# Patient Record
Sex: Male | Born: 1935 | Race: White | Hispanic: No | Smoking: Never smoker
Health system: Southern US, Community
[De-identification: ages and names within clinical notes are randomized; demographics above are authoritative.]

## PROBLEM LIST (undated history)

## (undated) DIAGNOSIS — I619 Nontraumatic intracerebral hemorrhage, unspecified: Secondary | ICD-10-CM

## (undated) DIAGNOSIS — E039 Hypothyroidism, unspecified: Secondary | ICD-10-CM

## (undated) DIAGNOSIS — I10 Essential (primary) hypertension: Secondary | ICD-10-CM

## (undated) DIAGNOSIS — K219 Gastro-esophageal reflux disease without esophagitis: Secondary | ICD-10-CM

## (undated) DIAGNOSIS — I639 Cerebral infarction, unspecified: Secondary | ICD-10-CM

## (undated) DIAGNOSIS — I219 Acute myocardial infarction, unspecified: Secondary | ICD-10-CM

## (undated) HISTORY — DX: Cerebral infarction, unspecified: I63.9

## (undated) HISTORY — DX: Nontraumatic intracerebral hemorrhage, unspecified: I61.9

## (undated) HISTORY — DX: Hypothyroidism, unspecified: E03.9

## (undated) HISTORY — PX: EYE SURGERY: SHX253

## (undated) HISTORY — DX: Essential (primary) hypertension: I10

---

## 2005-01-03 ENCOUNTER — Ambulatory Visit: Payer: Self-pay | Admitting: Unknown Physician Specialty

## 2005-08-17 ENCOUNTER — Ambulatory Visit: Payer: Self-pay | Admitting: Internal Medicine

## 2005-11-23 ENCOUNTER — Ambulatory Visit: Payer: Self-pay | Admitting: Internal Medicine

## 2006-09-14 ENCOUNTER — Other Ambulatory Visit: Payer: Self-pay

## 2006-09-14 ENCOUNTER — Inpatient Hospital Stay: Payer: Self-pay | Admitting: Internal Medicine

## 2008-09-14 ENCOUNTER — Ambulatory Visit: Payer: Self-pay | Admitting: Ophthalmology

## 2008-09-17 DIAGNOSIS — I619 Nontraumatic intracerebral hemorrhage, unspecified: Secondary | ICD-10-CM

## 2008-09-17 HISTORY — DX: Nontraumatic intracerebral hemorrhage, unspecified: I61.9

## 2008-09-27 ENCOUNTER — Ambulatory Visit: Payer: Self-pay | Admitting: Ophthalmology

## 2009-04-30 ENCOUNTER — Inpatient Hospital Stay (HOSPITAL_COMMUNITY): Admission: EM | Admit: 2009-04-30 | Discharge: 2009-05-18 | Payer: Self-pay | Admitting: Emergency Medicine

## 2009-05-09 ENCOUNTER — Ambulatory Visit: Payer: Self-pay | Admitting: Physical Medicine & Rehabilitation

## 2009-05-10 ENCOUNTER — Ambulatory Visit: Payer: Self-pay | Admitting: Pulmonary Disease

## 2009-05-11 ENCOUNTER — Encounter (INDEPENDENT_AMBULATORY_CARE_PROVIDER_SITE_OTHER): Payer: Self-pay | Admitting: Pulmonary Disease

## 2009-06-14 ENCOUNTER — Ambulatory Visit: Payer: Self-pay | Admitting: Internal Medicine

## 2009-06-20 ENCOUNTER — Encounter: Payer: Self-pay | Admitting: Internal Medicine

## 2009-07-18 ENCOUNTER — Encounter: Payer: Self-pay | Admitting: Internal Medicine

## 2010-04-19 ENCOUNTER — Ambulatory Visit: Payer: Self-pay | Admitting: Internal Medicine

## 2010-10-03 IMAGING — CT CT HEAD WITHOUT CONTRAST
1 series · 15 of 30 positions shown, 19 images · non-contrast
Comparison: none

REASON FOR EXAM: CVA mental status deterioration
COMMENTS:

[Series 2: soft tissue · axial · 0.46mm/px · z∈[+184,+318]mm · 15 of 30 slices shown, 19 images]
[im 2/30  brain]
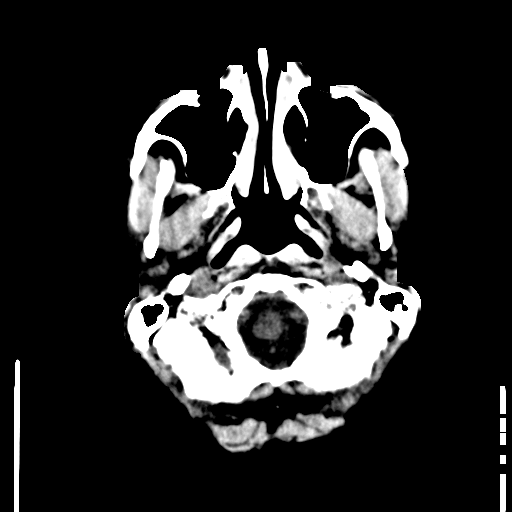
[im 2/30  bone]
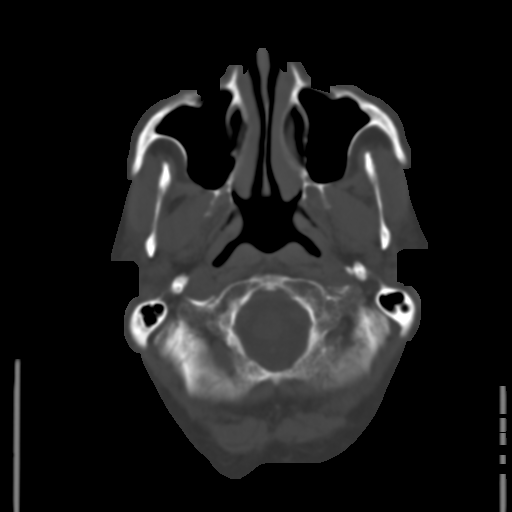
[im 4/30  brain]
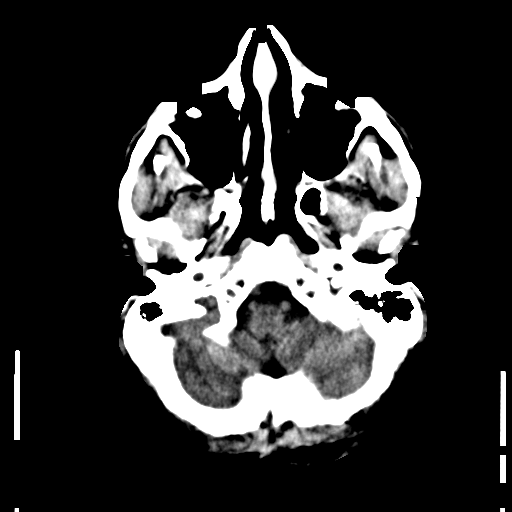
[im 6/30  brain]
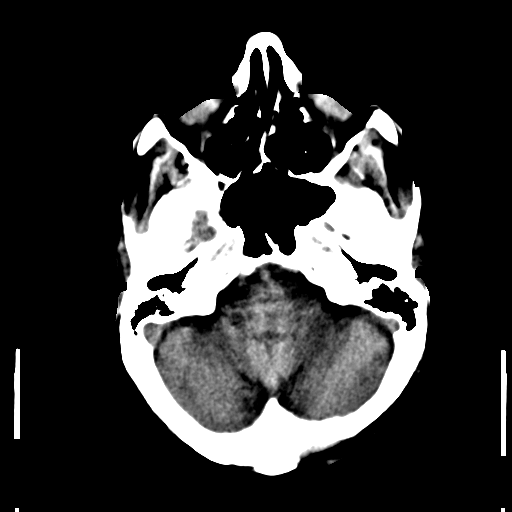
[im 8/30  brain]
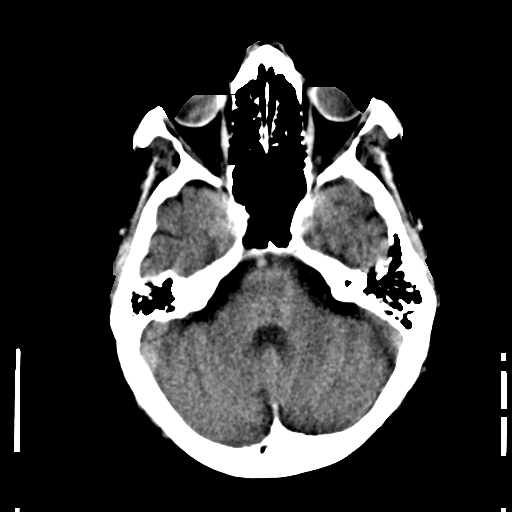
[im 10/30  brain]
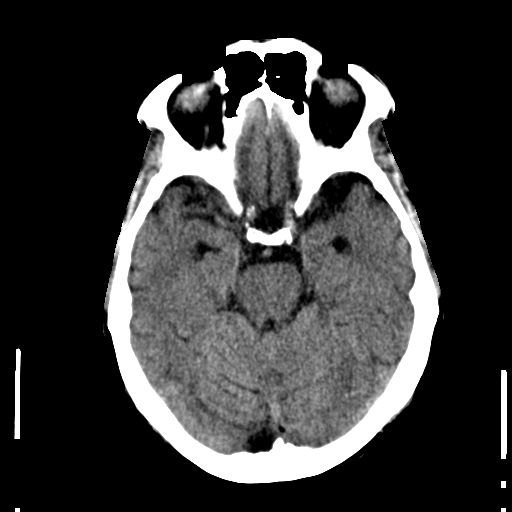
[im 10/30  bone]
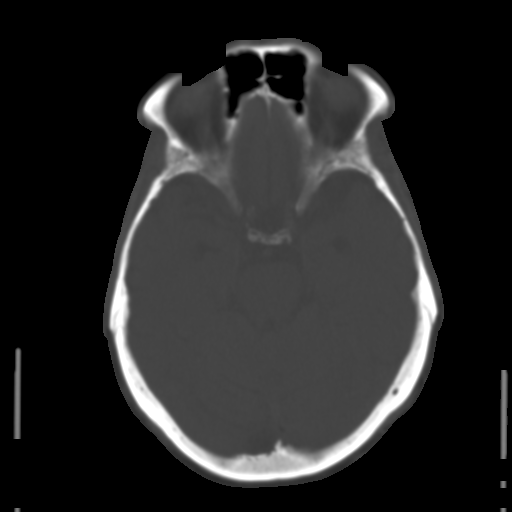
[im 12/30  brain]
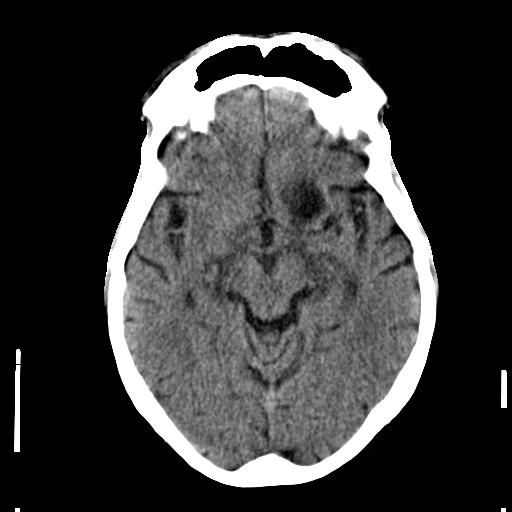
[im 14/30  brain]
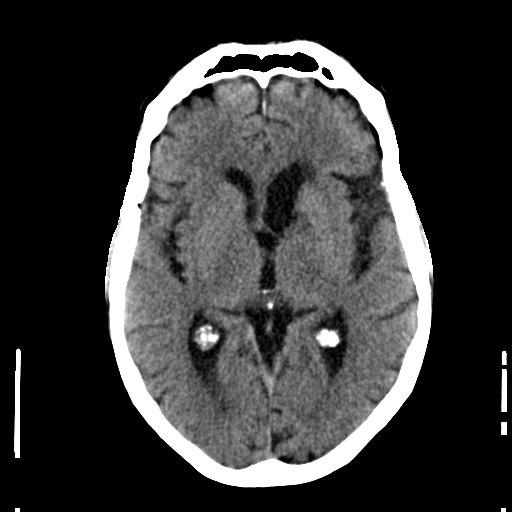
[im 16/30  brain]
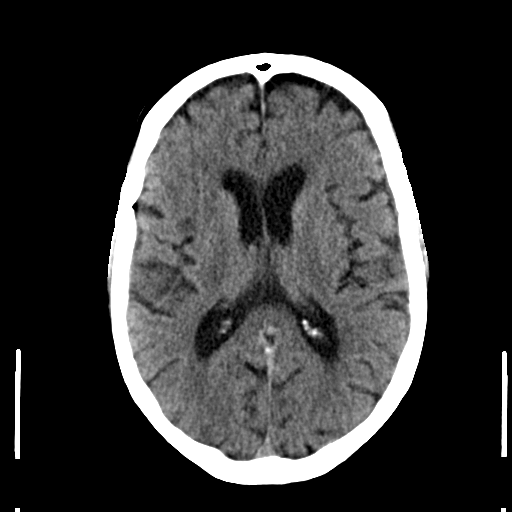
[im 17/30  brain]
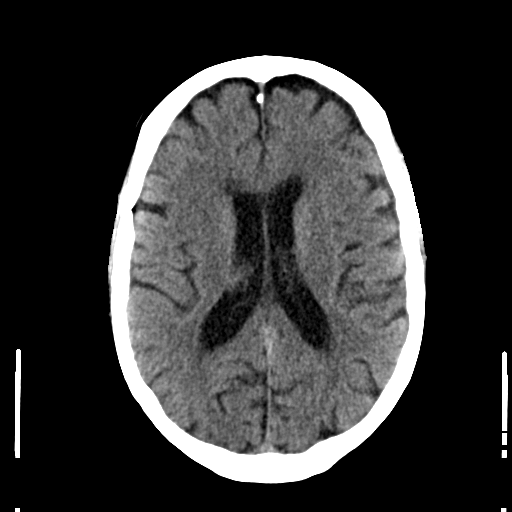
[im 17/30  bone]
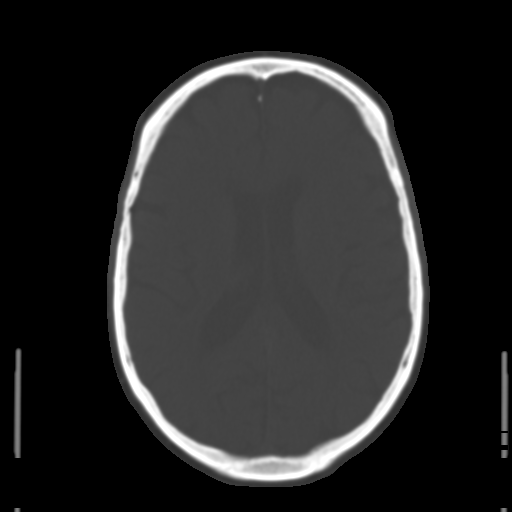
[im 19/30  brain]
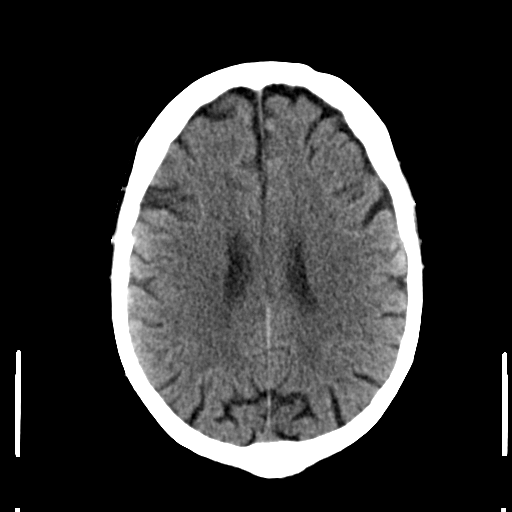
[im 21/30  brain]
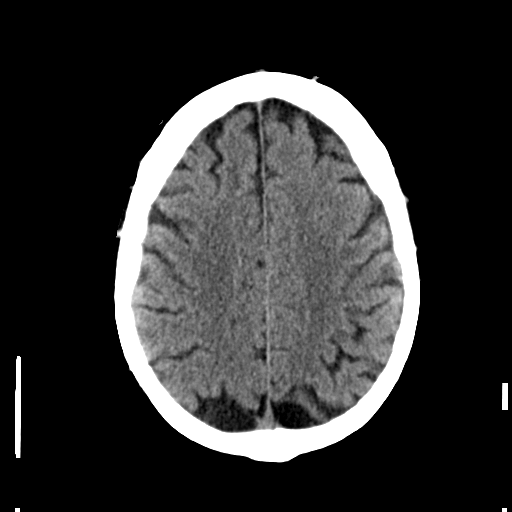
[im 23/30  brain]
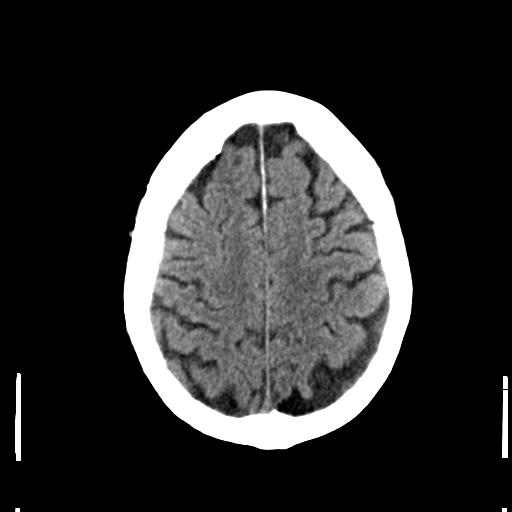
[im 25/30  brain]
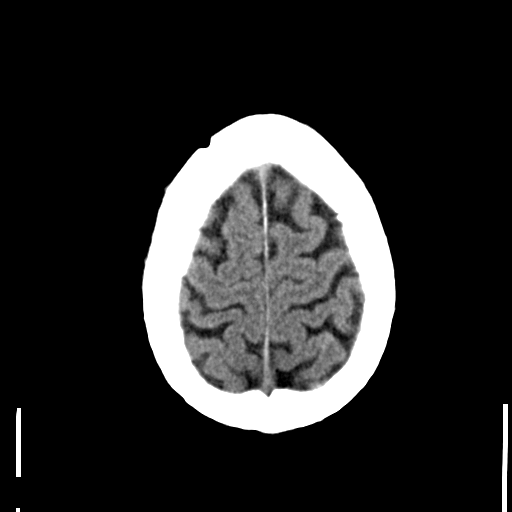
[im 25/30  bone]
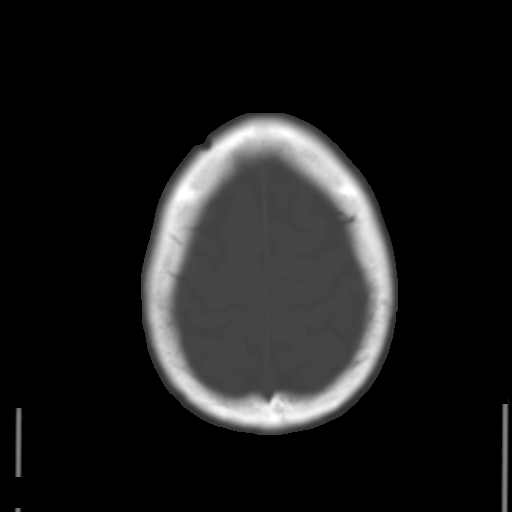
[im 27/30  brain]
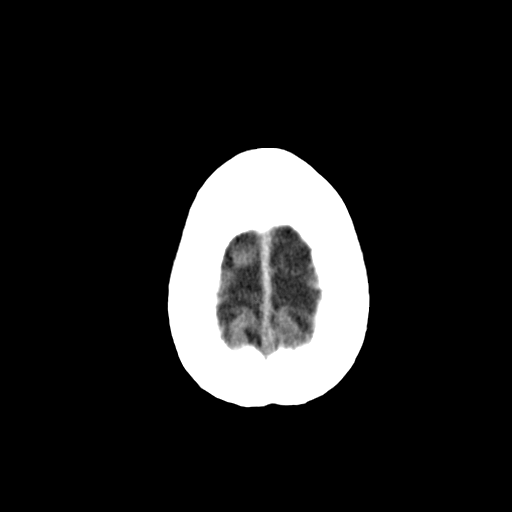
[im 29/30  brain]
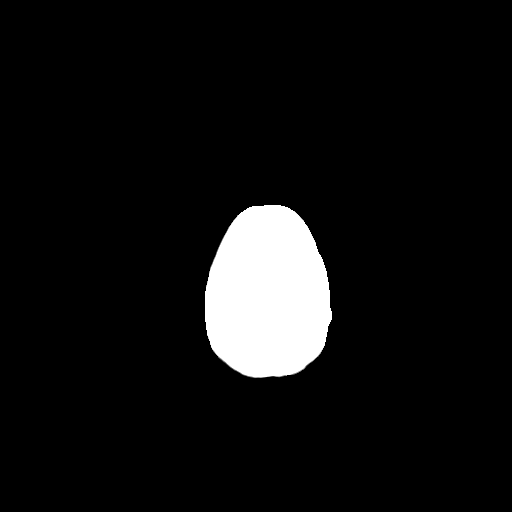

[15 of 30 positions shown; findings below may reference images not displayed]

PROCEDURE:     CT  - CT HEAD WITHOUT CONTRAST  - April 19, 2010  [DATE]

RESULT:     Axial noncontrast CT scanning was performed through the brain at
5 mm intervals and slice thicknesses. Comparison is made to the study 14 June, 2009.

The ventricles are normal in size and position. There is ex vacuo dilation
of the frontal horn of the left lateral ventricle due to adjacent
encephalomalacia at the site of previous infarction. There is mild diffuse
age-appropriate cerebral and cerebellar atrophy. There is no intracranial
hemorrhage nor evidence of acute evolving ischemic event. The cerebellum and
brainstem are normal in density. At bone window settings I do not see
evidence of an acute skull fracture. The observed portions of the paranasal
sinuses are clear.
IMPRESSION: 1. I do not see evidence of an acute ischemic or hemorrhagic infarction.
2. There has been ongoing evolution of an area of encephalomalacia in the
posterior left frontal lobe with adjacent ex vacuo dilation of the left
lateral ventricle.

## 2010-12-23 LAB — BASIC METABOLIC PANEL
BUN: 10 mg/dL (ref 6–23)
BUN: 15 mg/dL (ref 6–23)
BUN: 20 mg/dL (ref 6–23)
BUN: 7 mg/dL (ref 6–23)
CO2: 25 mEq/L (ref 19–32)
CO2: 25 mEq/L (ref 19–32)
CO2: 28 mEq/L (ref 19–32)
CO2: 29 mEq/L (ref 19–32)
CO2: 31 mEq/L (ref 19–32)
Calcium: 7.8 mg/dL — ABNORMAL LOW (ref 8.4–10.5)
Calcium: 8 mg/dL — ABNORMAL LOW (ref 8.4–10.5)
Calcium: 8.6 mg/dL (ref 8.4–10.5)
Calcium: 8.9 mg/dL (ref 8.4–10.5)
Chloride: 101 mEq/L (ref 96–112)
Chloride: 102 mEq/L (ref 96–112)
Chloride: 105 mEq/L (ref 96–112)
Chloride: 107 mEq/L (ref 96–112)
Chloride: 97 mEq/L (ref 96–112)
Creatinine, Ser: 0.69 mg/dL (ref 0.4–1.5)
Creatinine, Ser: 0.76 mg/dL (ref 0.4–1.5)
Creatinine, Ser: 0.76 mg/dL (ref 0.4–1.5)
Creatinine, Ser: 0.83 mg/dL (ref 0.4–1.5)
GFR calc Af Amer: >60 mL/min (ref >60)
GFR calc Af Amer: >60 mL/min (ref >60)
GFR calc Af Amer: >60 mL/min (ref >60)
GFR calc Af Amer: >60 mL/min (ref >60)
GFR calc non Af Amer: >60 mL/min (ref >60)
GFR calc non Af Amer: >60 mL/min (ref >60)
GFR calc non Af Amer: >60 mL/min (ref >60)
GFR calc non Af Amer: >60 mL/min (ref >60)
Glucose, Bld: 109 mg/dL — ABNORMAL HIGH (ref 70–99)
Glucose, Bld: 117 mg/dL — ABNORMAL HIGH (ref 70–99)
Glucose, Bld: 121 mg/dL — ABNORMAL HIGH (ref 70–99)
Glucose, Bld: 136 mg/dL — ABNORMAL HIGH (ref 70–99)
Potassium: 2.6 mEq/L — CL (ref 3.5–5.1)
Potassium: 2.8 mEq/L — ABNORMAL LOW (ref 3.5–5.1)
Potassium: 2.8 mEq/L — ABNORMAL LOW (ref 3.5–5.1)
Potassium: 3.4 mEq/L — ABNORMAL LOW (ref 3.5–5.1)
Sodium: 134 mEq/L — ABNORMAL LOW (ref 135–145)
Sodium: 136 mEq/L (ref 135–145)
Sodium: 137 mEq/L (ref 135–145)
Sodium: 137 mEq/L (ref 135–145)
Sodium: 138 mEq/L (ref 135–145)

## 2010-12-23 LAB — GLUCOSE, CAPILLARY
Glucose-Capillary: 100 mg/dL — ABNORMAL HIGH (ref 70–99)
Glucose-Capillary: 100 mg/dL — ABNORMAL HIGH (ref 70–99)
Glucose-Capillary: 100 mg/dL — ABNORMAL HIGH (ref 70–99)
Glucose-Capillary: 102 mg/dL — ABNORMAL HIGH (ref 70–99)
Glucose-Capillary: 104 mg/dL — ABNORMAL HIGH (ref 70–99)
Glucose-Capillary: 108 mg/dL — ABNORMAL HIGH (ref 70–99)
Glucose-Capillary: 109 mg/dL — ABNORMAL HIGH (ref 70–99)
Glucose-Capillary: 111 mg/dL — ABNORMAL HIGH (ref 70–99)
Glucose-Capillary: 112 mg/dL — ABNORMAL HIGH (ref 70–99)
Glucose-Capillary: 114 mg/dL — ABNORMAL HIGH (ref 70–99)
Glucose-Capillary: 114 mg/dL — ABNORMAL HIGH (ref 70–99)
Glucose-Capillary: 115 mg/dL — ABNORMAL HIGH (ref 70–99)
Glucose-Capillary: 117 mg/dL — ABNORMAL HIGH (ref 70–99)
Glucose-Capillary: 117 mg/dL — ABNORMAL HIGH (ref 70–99)
Glucose-Capillary: 118 mg/dL — ABNORMAL HIGH (ref 70–99)
Glucose-Capillary: 118 mg/dL — ABNORMAL HIGH (ref 70–99)
Glucose-Capillary: 122 mg/dL — ABNORMAL HIGH (ref 70–99)
Glucose-Capillary: 131 mg/dL — ABNORMAL HIGH (ref 70–99)
Glucose-Capillary: 132 mg/dL — ABNORMAL HIGH (ref 70–99)
Glucose-Capillary: 133 mg/dL — ABNORMAL HIGH (ref 70–99)
Glucose-Capillary: 135 mg/dL — ABNORMAL HIGH (ref 70–99)
Glucose-Capillary: 143 mg/dL — ABNORMAL HIGH (ref 70–99)
Glucose-Capillary: 148 mg/dL — ABNORMAL HIGH (ref 70–99)
Glucose-Capillary: 148 mg/dL — ABNORMAL HIGH (ref 70–99)
Glucose-Capillary: 150 mg/dL — ABNORMAL HIGH (ref 70–99)
Glucose-Capillary: 156 mg/dL — ABNORMAL HIGH (ref 70–99)
Glucose-Capillary: 196 mg/dL — ABNORMAL HIGH (ref 70–99)
Glucose-Capillary: 96 mg/dL (ref 70–99)
Glucose-Capillary: 96 mg/dL (ref 70–99)
Glucose-Capillary: 99 mg/dL (ref 70–99)

## 2010-12-23 LAB — COMPREHENSIVE METABOLIC PANEL
ALT: 53 U/L (ref 0–53)
Alkaline Phosphatase: 76 U/L (ref 39–117)
BUN: 19 mg/dL (ref 6–23)
Chloride: 98 mEq/L (ref 96–112)
Glucose, Bld: 100 mg/dL — ABNORMAL HIGH (ref 70–99)
Potassium: 2.9 mEq/L — ABNORMAL LOW (ref 3.5–5.1)
Sodium: 133 mEq/L — ABNORMAL LOW (ref 135–145)
Total Bilirubin: 1.6 mg/dL — ABNORMAL HIGH (ref 0.3–1.2)
Total Protein: 6.7 g/dL (ref 6.0–8.3)

## 2010-12-23 LAB — BLOOD GAS, ARTERIAL
Acid-Base Excess: 3.1 mmol/L — ABNORMAL HIGH (ref 0.0–2.0)
Acid-base deficit: 0.4 mmol/L (ref 0.0–2.0)
Acid-base deficit: 1.3 mmol/L (ref 0.0–2.0)
Drawn by: 31996
FIO2: 0.3 %
FIO2: 100 %
MECHVT: 550 mL
MECHVT: 550 mL
MECHVT: 550 mL
O2 Saturation: 98.3 %
O2 Saturation: 98.5 %
O2 Saturation: 99.8 %
PEEP: 5 cmH2O
Patient temperature: 97.7
RATE: 16 resp/min
RATE: 16 resp/min
TCO2: 27 mmol/L (ref 0–100)
pCO2 arterial: 35.9 mmHg (ref 35.0–45.0)
pH, Arterial: 7.52 — ABNORMAL HIGH (ref 7.350–7.450)

## 2010-12-23 LAB — URINALYSIS, ROUTINE W REFLEX MICROSCOPIC
Glucose, UA: NEGATIVE mg/dL
Ketones, ur: NEGATIVE mg/dL
Nitrite: NEGATIVE
Specific Gravity, Urine: 1.02 (ref 1.005–1.030)
pH: 7 (ref 5.0–8.0)

## 2010-12-23 LAB — LIPID PANEL
HDL: 52 mg/dL (ref >39)
Total CHOL/HDL Ratio: 5.3 RATIO
Triglycerides: 70 mg/dL (ref <150)
VLDL: 14 mg/dL (ref 0–40)

## 2010-12-23 LAB — CULTURE, BLOOD (ROUTINE X 2)

## 2010-12-23 LAB — HEMOGLOBIN A1C
Hgb A1c MFr Bld: 5.7 % (ref 4.6–6.1)
Mean Plasma Glucose: 117 mg/dL

## 2010-12-23 LAB — VANCOMYCIN, TROUGH: Vancomycin Tr: 6.8 ug/mL — ABNORMAL LOW (ref 10.0–20.0)

## 2010-12-23 LAB — CBC
HCT: 37.7 % — ABNORMAL LOW (ref 39.0–52.0)
HCT: 43.2 % (ref 39.0–52.0)
HCT: 49.5 % (ref 39.0–52.0)
Hemoglobin: 12.5 g/dL — ABNORMAL LOW (ref 13.0–17.0)
Hemoglobin: 14.6 g/dL (ref 13.0–17.0)
Hemoglobin: 15.5 g/dL (ref 13.0–17.0)
Hemoglobin: 16.8 g/dL (ref 13.0–17.0)
MCHC: 33.8 g/dL (ref 30.0–36.0)
MCHC: 33.8 g/dL (ref 30.0–36.0)
MCHC: 34 g/dL (ref 30.0–36.0)
MCV: 96.1 fL (ref 78.0–100.0)
MCV: 96.2 fL (ref 78.0–100.0)
MCV: 96.3 fL (ref 78.0–100.0)
Platelets: 144 10*3/uL — ABNORMAL LOW (ref 150–400)
Platelets: 211 10*3/uL (ref 150–400)
RBC: 3.86 MIL/uL — ABNORMAL LOW (ref 4.22–5.81)
RBC: 3.93 MIL/uL — ABNORMAL LOW (ref 4.22–5.81)
RBC: 4.49 MIL/uL (ref 4.22–5.81)
RBC: 4.74 MIL/uL (ref 4.22–5.81)
RBC: 5.16 MIL/uL (ref 4.22–5.81)
RDW: 12.8 % (ref 11.5–15.5)
RDW: 13 % (ref 11.5–15.5)
WBC: 10.9 10*3/uL — ABNORMAL HIGH (ref 4.0–10.5)
WBC: 15.8 10*3/uL — ABNORMAL HIGH (ref 4.0–10.5)
WBC: 24.1 10*3/uL — ABNORMAL HIGH (ref 4.0–10.5)

## 2010-12-23 LAB — DIFFERENTIAL
Basophils Absolute: 0 10*3/uL (ref 0.0–0.1)
Basophils Relative: 0 % (ref 0–1)
Lymphocytes Relative: 17 % (ref 12–46)
Monocytes Relative: 6 % (ref 3–12)
Neutro Abs: 8.5 10*3/uL — ABNORMAL HIGH (ref 1.7–7.7)
Neutrophils Relative %: 73 % (ref 43–77)

## 2010-12-23 LAB — FUNGUS CULTURE W SMEAR

## 2010-12-23 LAB — MAGNESIUM
Magnesium: 2 mg/dL (ref 1.5–2.5)
Magnesium: 2.1 mg/dL (ref 1.5–2.5)

## 2010-12-23 LAB — CSF CELL COUNT WITH DIFFERENTIAL
RBC Count, CSF: 20025 /mm3 — ABNORMAL HIGH
Tube #: 3

## 2010-12-23 LAB — CSF CULTURE W GRAM STAIN: Gram Stain: NONE SEEN

## 2010-12-23 LAB — URINE CULTURE: Colony Count: NO GROWTH

## 2010-12-23 LAB — EXPECTORATED SPUTUM ASSESSMENT W GRAM STAIN, RFLX TO RESP C

## 2010-12-23 LAB — POTASSIUM: Potassium: 3.4 mEq/L — ABNORMAL LOW (ref 3.5–5.1)

## 2010-12-23 LAB — URINE MICROSCOPIC-ADD ON

## 2010-12-23 LAB — PROTEIN AND GLUCOSE, CSF
Glucose, CSF: 44 mg/dL (ref 43–76)
Total  Protein, CSF: 268 mg/dL — ABNORMAL HIGH (ref 15–45)

## 2010-12-23 LAB — CULTURE, RESPIRATORY W GRAM STAIN

## 2010-12-23 LAB — PHOSPHORUS: Phosphorus: 1.9 mg/dL — ABNORMAL LOW (ref 2.3–4.6)

## 2010-12-23 LAB — AFB CULTURE WITH SMEAR (NOT AT ARMC)

## 2010-12-24 LAB — DIFFERENTIAL
Basophils Absolute: 0 10*3/uL (ref 0.0–0.1)
Basophils Absolute: 0.1 10*3/uL (ref 0.0–0.1)
Basophils Relative: 0 % (ref 0–1)
Eosinophils Relative: 2 % (ref 0–5)
Lymphocytes Relative: 36 % (ref 12–46)
Neutro Abs: 5.1 10*3/uL (ref 1.7–7.7)
Neutro Abs: 8.4 10*3/uL — ABNORMAL HIGH (ref 1.7–7.7)
Neutrophils Relative %: 54 % (ref 43–77)
Neutrophils Relative %: 89 % — ABNORMAL HIGH (ref 43–77)

## 2010-12-24 LAB — COMPREHENSIVE METABOLIC PANEL
Alkaline Phosphatase: 82 U/L (ref 39–117)
BUN: 14 mg/dL (ref 6–23)
BUN: 14 mg/dL (ref 6–23)
CO2: 26 mEq/L (ref 19–32)
Chloride: 104 mEq/L (ref 96–112)
Chloride: 104 mEq/L (ref 96–112)
Creatinine, Ser: 0.86 mg/dL (ref 0.4–1.5)
GFR calc non Af Amer: >60 mL/min (ref >60)
GFR calc non Af Amer: >60 mL/min (ref >60)
Glucose, Bld: 124 mg/dL — ABNORMAL HIGH (ref 70–99)
Glucose, Bld: 126 mg/dL — ABNORMAL HIGH (ref 70–99)
Potassium: 3.7 mEq/L (ref 3.5–5.1)
Total Bilirubin: 1 mg/dL (ref 0.3–1.2)
Total Bilirubin: 1.2 mg/dL (ref 0.3–1.2)

## 2010-12-24 LAB — URINALYSIS, ROUTINE W REFLEX MICROSCOPIC
Glucose, UA: NEGATIVE mg/dL
Ketones, ur: NEGATIVE mg/dL
pH: 5.5 (ref 5.0–8.0)

## 2010-12-24 LAB — CBC
HCT: 47.2 % (ref 39.0–52.0)
HCT: 48.3 % (ref 39.0–52.0)
Hemoglobin: 15.9 g/dL (ref 13.0–17.0)
MCV: 96.1 fL (ref 78.0–100.0)
Platelets: 222 10*3/uL (ref 150–400)
RDW: 13.1 % (ref 11.5–15.5)
RDW: 13.2 % (ref 11.5–15.5)
WBC: 9.5 10*3/uL (ref 4.0–10.5)

## 2010-12-24 LAB — TROPONIN I: Troponin I: 0.02 ng/mL (ref 0.00–0.06)

## 2010-12-24 LAB — GLUCOSE, CAPILLARY: Glucose-Capillary: 159 mg/dL — ABNORMAL HIGH (ref 70–99)

## 2010-12-24 LAB — APTT: aPTT: 37 seconds (ref 24–37)

## 2010-12-24 LAB — PROTIME-INR
INR: 1.1 (ref 0.00–1.49)
INR: 1.1 (ref 0.00–1.49)
Prothrombin Time: 13.7 seconds (ref 11.6–15.2)

## 2010-12-24 LAB — CK TOTAL AND CKMB (NOT AT ARMC): Relative Index: INVALID (ref 0.0–2.5)

## 2011-01-30 NOTE — Discharge Summary (Signed)
NAMEGUSTABO, Ryan Cantu                ACCOUNT NO.:  [id number]   MEDICAL RECORD NO.:  [id number]          PATIENT TYPE:  INP   LOCATION:  3025                         FACILITY:  [facility]   PHYSICIAN:  Pramod P. Pearlean Brownie, MD    DATE OF BIRTH:  1924-08-31   DATE OF ADMISSION:  04/30/2009  DATE OF DISCHARGE:  05/18/2009                               DISCHARGE SUMMARY   DISCHARGE DIAGNOSES:  1. 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  2. Septicemia, enterococcus.  3. Hypertension.  4. Hypothyroidism.  5. Macular degeneration.  6. Hypokalemia.   DISCHARGE MEDICATIONS:  1. PreserVision 1 drop in both eyes a day.  2. Glucosamine 1 a day.  3. Levothyroxine 100 mcg a day.  4. Prilosec 20 mg a day.  5. Norvasc 10 mg a day.  6. Hydrochlorothiazide 25 mg a day.  7. Lovenox 40 mg subcutaneous a day.  8. K-Dur 10 mEq q.i.d.  9. Ampicillin 500 mg q.6 h. through May 23, 2009, then      discontinue.  10.Diflucan 100 mg p.o. a day, started May 13, 2009 through      May 26, 2009 and then discontinue.  11.Ensure supplement t.i.d.   STUDIES PERFORMED:  1. CT of the brain on admission shows a left basal ganglia and      intraparenchymal hemorrhage with extension into the ventricular      system.  Follow up CT 24 hours shows placement of ventriculostomy      with mild increase in intraventricular hemorrhage.  No significant      change in overall size of intraparenchymal hemorrhage.  CT angio of      the head and 48 hours shows mild atherosclerotic disease in the      cavernous carotids bilaterally without significant carotid or      intracranial stenosis.  No aneurysm or other explanation for      intracranial hemorrhage.  Ventricles are slightly smaller compared      with previous CT.  CT of the head hospital day number 5 shows      enlarging ventricles with increased dimension noted to the right      frontal horn and increased  prominence of temporal tips concerning      for developing hydrocephalus.  No significant change in left basal      ganglia hemorrhage, stable minimal subarachnoid hemorrhage on the      left.  Intraventricular blood products stable within the lateral      ventricles and less conspicuous within the third ventricle.  CT      hospital day number 7 shows left basal ganglia hematoma unchanged.      CT of the brain hospital day number 9 again shows stable left basal      ganglia hemorrhage with interval decrease in intraventricular      blood. CT of the brain hospital day number 11 shows external      ventricular drain removed with stable ventricle size and stable      ventricular intraventricular hemorrhage.  2.  Last chest x-ray performed May 13, 2009 shows improved basilar      atelectasis with developing pulmonary versus hypertension without      overt edema.  3. Multiple abdominal x-rays verifying Panda tube placement with no      acute abnormalities.  4. Carotid Doppler not performed.  5. A 2-D echocardiogram not performed.  6. EKG shows normal sinus rhythm.  7. EEG within normal limits, though suboptimal due to excessive motion      artifact.  No epileptiform activity noted.   LABORATORY DATA:  Chemistry with sodium 134, potassium 3.3, glucose 112.  CBC with white blood cells 11.5, otherwise normal.  There was a urine  culture done May 14, 2009 that showed no growth.  CSF culture May 10, 2009, no growth.  Respiratory culture May 10, 2009 with moderate  Candida and white blood cells.  Blood cultures x2 shows enterococcus  species.  Urine culture shows enterococcus species.  AFB culture  negative.  Fungal culture negative.  Cerebral spinal fluid cell count  with 20,025 red blood cells, 3 white blood cells, rare neutrophils, few  lymphocytes, occasional monocytes, protein 268, glucose 44, hemoglobin  A1c 5.7, cholesterol 273, triglycerides 70, HDL 52, LDL 207.   HISTORY OF  PRESENT ILLNESS:  Mr. Ryan Cantu is an 77 year old right-  handed Caucasian male who developed sudden onset of slurred speech with  apparent confusion and right-sided weakness at 11:30 a.m. the morning of  admission.  The patient has no history of stroke or TIA.  There were no  other associated symptoms.  He did not complain of a headache.  CT of  the brain showed a large left basal ganglia hemorrhage with extension  into the left lateral and right lateral ventricles as well as third and  fourth ventricle with dilation of his right ventricle and effacement of  the left ventricle.  There was a 5 mm midline shift.  The patient  remained conscious.  Speech output had been minimal and garbled since  arrival to the emergency room.  He was nauseated and required Zofran in  the emergency room for control of nausea and vomiting.  He has been  lethargic, but easy to arouse.  He was responsive to tactile  stimulation.  He was not a TPA candidate secondary to hemorrhage.  He  was admitted to the neuro ICU for further evaluation.   HOSPITAL COURSE:  Soon after arrival to the ICU, ventriculostomy was  placed for increase in size of hemorrhage.  We were suspicious for  etiology of the hemorrhage being aneurysm, but a CT angio was performed  which showed no cause of the hemorrhage.  The cause was then felt to be  hypertensive in nature.  The patient remained in the ICU with  ventriculostomy in place and ventriculostomy clamped after 1 week.  During that time, he required [facility] for tube feedings as he could not  swallow.  The patient tolerated ventriculostomy clamped and then several  days later was transferred to the floor.  He was working with PT and OT  and progressing toward rehab.  Once to the neurologic floor, he then  developed a temperature 101.8 and 102.1.  It was unclear the etiology.  It was thought it may be from his urine.  Within 24 hours, the patient  became increased lethargic and  decreased responsiveness.  He was taken  back to the ICU and intubated.  It was initially thought that he had  meningitis,  but he ended up having septicemia.  Enterococcus growing in  his blood cultures.  He was put on antibiotics there and managed on the  ventilator for several days.  EEG was performed which ruled out  seizures.  Once the patient was neurologically stable and following  commands, he was extubated.  He tolerated the extubation well and was  transferred back to the floor.  Once to the floor, the patient was again  evaluated by PT and OT.  Based on family support and rehab needs,  skilled nursing facility placement was pursued.   Related to swallowing, the patient was eventually able to pass a  modified barium swallow for a dysphagia 1 thin liquid diet.  Apparently  prior to admission, the patient had problems with sinus dripping and  sticking in my throat.  Speech therapist suspected the patient had food  residuals as the globus sensation and was likely not postnasal drip due  to anatomical weakness that was noted during testing.  She felt he was  safe for a dysphagia 1 thin liquid diet as long as he performed 5-6  swallows after every bite with a sip of liquid.  This seems to be  somewhat of a fatigue on him and the patient is still an aspiration  risk, though we do not feel he is a good candidate for a PEG.  We will  continue this at the skilled nursing facility.   CONDITION ON DISCHARGE:  Patient awake, alert, oriented to person and  place, and planned for a skilled nursing facility placement.  He has no  aphasia or dysarthria.  His eye movements are full.  His face is  symmetric.  His tongue is midline.  He has no drift in his upper  extremities.  His heart rate is regular.  His breath sounds are clear.   DISCHARGE/PLAN:  1. Discharge to a skilled nursing facility for ongoing PT, OT and      speech therapy.  2. No antiplatelets at this time.  3. Would resume  aspirin 81 mg 4 weeks after initial stroke for stroke      prevention.  4. A dysphagia 1 thin liquid diet with crushed medicines and no      straws.  5. Follow up primary care physician at skilled nursing facility.  6. Follow up with Dr. Delia Heady in 2-3 months.      Annie Main, N.P.    ______________________________  Sunny Schlein. Pearlean Brownie, MD    SB/MEDQ  D:  05/18/2009  T:  05/18/2009  Job:  [id number]   cc:   Pramod P. Pearlean Brownie, MD  Bethann Punches

## 2011-01-30 NOTE — H&P (Signed)
NAMEMarland Kitchen  NUR, KRASINSKI NO.:  [id number]   MEDICAL RECORD NO.:  [id number]          PATIENT TYPE:  INP   LOCATION:  3102                         FACILITY:  [facility]   PHYSICIAN:  Noel Christmas, MD    DATE OF BIRTH:  16-Mar-1924   DATE OF ADMISSION:  04/30/2009  DATE OF DISCHARGE:                              HISTORY & PHYSICAL   CHIEF COMPLAINT:  Acute onset of slurred speech with right-sided  weakness, presenting as code stroke.   HISTORY OF PRESENT ILLNESS:  This is an 77 year old man who experienced  sudden onset of slurred speech with apparent confusion as well as right-  sided weakness at about 11:30, this morning.  The patient has no  previous history of stroke or TIA.  There were no other associated  symptoms.  He did not complain of headache.  CT scan of his head showed  large left basal ganglia hemorrhage with extension into the left lateral  and right lateral ventricles as well as third and fourth ventricles with  dilation of his right ventricle and effacement of the left ventricle.  There was a 5 mm midline shift.  The patient has remained conscious.  Speech output has been minimal and garble since arrival in the emergency  room.  He has been nauseated and has required Zofran for control of his  nausea and vomiting.  He been lethargic, but easy to arouse and has been  responsive to verbal as well as tactile stimulation.   PAST MEDICAL HISTORY:  Remarkable for hypothyroidism and macular  degeneration.  He has no history of hypertension nor diabetes mellitus.  There is also no history of hyperlipidemia.   CURRENT MEDICATIONS:  Thyroid medicine.   ALLERGIES:  SULFA drugs.   FAMILY HISTORY:  Negative for stroke and noncontributory.   SOCIAL HISTORY:  The patient is married, lives with his wife.  He is  retired, but has remained physically active and has functioned  independently.  There is no history of tobacco use nor alcohol  consumption.   REVIEW  OF SYSTEMS:  Noncontributory, except for presenting deficits and  complaints as described above.   PHYSICAL EXAMINATION:  VITAL SIGNS:  Blood pressure was 187/92, pulse 68  per minute, respirations 16 per minute and oxygen saturation was 98% on  2 L of oxygen by nasal cannula per minute.  GENERAL:  The appearance was that of an elderly man, slender to medium  built who was slightly lethargic, but easy to arouse.  He was  intermittently nauseated and at times vomited.  HEAD, EYES, EARS, NOSE AND THROAT:  Normal.  NECK:  Supple with no apparent tenderness and no masses.  CHEST:  Clear to auscultation.  CARDIAC:  Normal.  ABDOMEN:  Soft with normal bowel sounds.  No tenderness.  Skin and  mucosa were normal.  EXTREMITIES:  Normal.  Peripheral pulses were also normal.  The patient  had normal male genitalia.  RECTAL:  Deferred.  NEUROLOGICAL:  His pupils were small, but equal and both reactive to  light equally.  Extraocular movements were full on lateral gaze,  right  and left.  The patient had a gaze preference to the left side.  He had  right visual field defect that was moderately dense and moderate right  and left facial weakness.  Hearing was apparently normal.  Speech was  markedly dysarthric and unintelligible.  The patient was able to swallow  without choking.  Motor exam showed no movement of his right upper  extremity.  He had minimal withdrawal movement of his right lower  extremity to noxious stimulus.  He moves left extremities well.  Deep  tendon reflexes were symmetrical.  Plantar responses were met.  He had  sensory neglect to his entire right side.  Carotid auscultation was  normal.   LABORATORY STUDIES:  WBC count was 9.4, hemoglobin 16.4, hematocrit  48.3, and platelet count 222,000.  Prothrombin time was 13.7, INR 1.1,  and APTT is 35.  Serum sodium was 139, potassium 3.7, chloride 106, CO2  of 26, BUN 14, creatinine 0.86, glucose 124, and calcium 9.3.  Total CK   was 99, CK-MB 3.2, and troponin 0.02.   CLINICAL IMPRESSION:  Acute large basal ganglia hemorrhage with  extension into ventricle system including both lateral ventricles as  well as third and fourth ventricles with effacement of the left lateral  ventricle and dilation of right lateral ventricle.  The patient has a  small (5 mm) midline shift, left to right.   PLAN:  1. Ventriculostomy for impending obstructive hydrocephalus.  Dr.      Cheree Ditto has been consulted.  2. We will monitor closely in intensive care unit and obtain repeat CT      studies of his head as needed.  3. Physical therapy, occupational therapy, and speech therapy      intervention when feasible.  4. MRI and MRA of the head and neck when feasible.      Noel Christmas, MD  Electronically Signed     CS/MEDQ  D:  04/30/2009  T:  04/30/2009  Job:  [id number]

## 2011-01-30 NOTE — Procedures (Signed)
EEG NUMBER:  10 - [id number].   REFERRING PHYSICIAN:  Oley Balm. Sung Amabile, MD   CLINICAL HISTORY:  An 77 year old patient with intracerebral hemorrhage  who was intubated.  EEG was done for evaluation of her mental status and  subclinical seizures.   Medication listed, Ventolin, Norvasc, Lovenox, NovoLog, Protonix,  potassium, fentanyl.   This is a portable EEG recorded with the patient described as being  lethargic using standard 17-channel machine and 10-20 electrode  placement.   Background awake rhythm consists of 8-9 Hz alpha which is of moderate  amplitude, synchronous, reactive to eye opening and closure.  Intermittent 6-7 Hz theta slowing is seen bilaterally in diffuse  distribution.  The record is suboptimal with excessive muscle movement  artifacts seen throughout the recording.  No paroxysmal epileptiform  activity spikes or sharp waves are noted.  Length of the recording is  26.5 minutes.  Hyperventilation and photic stimulation not performed.  EKG tracing reveals regular sinus rhythm.  Technical component of the  study is suboptimal.   IMPRESSION:  This portable EEG is probably within normal limits though  suboptimal due to excessive motion artifacts.  No definite epileptiform  features noted.           ______________________________  Sunny Schlein. Pearlean Brownie, MD     EAV:[id number]  D:  05/11/2009 18:27:26  T:  05/12/2009 06:58:12  Job #:  [id number]   cc:   Oley Balm. Sung Amabile, MD  [address]  [place]  [place] [address]

## 2011-01-30 NOTE — Op Note (Signed)
[name], Ryan Cantu                ACCOUNT NO.:  [id number]   MEDICAL RECORD NO.:  [id number]          PATIENT TYPE:  INP   LOCATION:  3102                         FACILITY:  [facility]   PHYSICIAN:  Donalee Citrin, M.D.        DATE OF BIRTH:  06/26/1924   DATE OF PROCEDURE:  04/30/2009  DATE OF DISCHARGE:                               OPERATIVE REPORT   PREOPERATIVE DIAGNOSES:  Left basal ganglia hemorrhage, intraventricular  hemorrhage and hydrocephalus.   PROCEDURE:  Intraventricular drain placement through a right frontal  Kocher's entry point   SURGEON:  Donalee Citrin, MD   ANESTHESIA:  Local with sedation.   OPERATIVE PROCEDURE:  The patient is an 77 year old gentleman who was  admitted with hydrocephalus and intraventricular hemorrhage.  Family was  counseled risks and benefits of intraventricular drain placement, so the  patient was stabilized in the ICU.  The right side of his head was  shaved, prepped, and draped in the usual sterile fashioned.  Kocher's  point was identified proximal 1-2 mm anterior to the coronal suture in  the mid pupillary line.  After adequate local anesthesia was applied, an  incision was made and a Kocher's point bur hole was drilled with a twist  drill.  The dura was then pierced with spinal needle.  A ventricular  catheter was then placed.  It took approximately 3 passes for the  ventricular catheter to pass in the brain and until we got brisk clear  spinal fluid egressed that appeared to be under pressure.  This was then  tunneled through and hooked up to a collecting system and the drain was  sewn in place.  The patient was then re-admitted to the ICU in stable  condition.  At the end of procedure, all sharps were discarded and  counts were correct.           ______________________________  Donalee Citrin, M.D.     GC/MEDQ  D:  04/30/2009  T:  04/30/2009  Job:  [id number]

## 2011-01-30 NOTE — Consult Note (Signed)
Ryan Cantu, Ryan Cantu NO.:  [id number]   MEDICAL RECORD NO.:  [id number]          PATIENT TYPE:  EMS   LOCATION:  MAJO                         FACILITY:  [facility]   PHYSICIAN:  Donalee Citrin, M.D.        DATE OF BIRTH:  [birth year]   DATE OF CONSULTATION:  04/30/2009  DATE OF DISCHARGE:                                 CONSULTATION   REASON FOR CONSULTATION:  Left basal ganglia presumptive hypertensive  hemorrhage with intraventricular extension.   HISTORY OF PRESENT ILLNESS:  The patient is an 77 year old gentleman who  presented to the emergency room after altered leve of consciousness,  confusion, difficulty with speech, and weakness on his right side.  On  evaluation in the ER, his admission blood pressure was 201/104 has come  down to 183/85 with labetalol.  The patient was noted to be dysarthric.  He would awake and arouse and he follows simple commands, but he would  not vocalize.  CT scan obtained showed basal ganglia hemorrhage with  intraventricular extension, and Neurology and Neurosurgery have been  consulted.   Further details of past medical, surgical, and social history please see  dictated admission history and physical from Dr. Roseanne Reno, Neurology.  The patient's family was unavailable during the initial history taking.   On exam, the patient is arousable to voice.  He follows commands.  He is  sticking out his tongue and holding up 2 fingers with his left hand.  He  does squeeze both hands bilaterally, but does appear to have a right  hemiparesis.  His pupils appeared to be equally as a right central  seventh.  Otherwise, his cranial nerves appeared to be intact.  His CT  scan shows 3 x 4 cm deep left basal ganglia hemorrhage with  intraventricular extension and casting of his left lateral ventricle,  3rd and 4th ventricle with some mild dilatation of the right lateral  ventricle.   ASSESSMENT AND PLAN:  This is an 77 year old gentleman with  hypertensive  basal ganglia hemorrhage with intraventricular extension.  The patient  will be admitted to Neurology.  He will be placed in the Neuro ICU.  Upon arrival to Neuro ICU, we will plan on placing intraventricular  drain through a right frontal approach.  The risks and benefits of that  procedure have been discussed with the patient's daughter and family.  The patient's admission INR is 1.1, so that is not preclusive.  Remainder of his lab work was remarkable for glucose of 163 and  hemoglobin of 16.           ______________________________  Donalee Citrin, M.D.     GC/MEDQ  D:  04/30/2009  T:  04/30/2009  Job:  [id number]

## 2013-07-16 ENCOUNTER — Emergency Department: Payer: Self-pay | Admitting: Emergency Medicine

## 2013-07-16 DIAGNOSIS — I639 Cerebral infarction, unspecified: Secondary | ICD-10-CM

## 2013-07-16 HISTORY — DX: Cerebral infarction, unspecified: I63.9

## 2013-07-16 LAB — URINALYSIS, COMPLETE
Bilirubin,UR: NEGATIVE
Blood: NEGATIVE
Glucose,UR: NEGATIVE mg/dL (ref 0–75)
Ketone: NEGATIVE
Leukocyte Esterase: NEGATIVE
Nitrite: NEGATIVE
Protein: 30
Specific Gravity: 1.012 (ref 1.003–1.030)
WBC UR: 2 /HPF (ref 0–5)

## 2013-07-16 LAB — COMPREHENSIVE METABOLIC PANEL
Albumin: 3.7 g/dL (ref 3.4–5.0)
Co2: 27 mmol/L (ref 21–32)
EGFR (African American): >60
EGFR (Non-African Amer.): >60
Osmolality: 276 (ref 275–301)
Potassium: 3.9 mmol/L (ref 3.5–5.1)
SGPT (ALT): 24 U/L (ref 12–78)
Total Protein: 7.3 g/dL (ref 6.4–8.2)

## 2013-07-16 LAB — CBC
HCT: 50 % (ref 40.0–52.0)
HGB: 16.7 g/dL (ref 13.0–18.0)
MCH: 31.2 pg (ref 26.0–34.0)
MCHC: 33.4 g/dL (ref 32.0–36.0)
Platelet: 206 10*3/uL (ref 150–440)
RDW: 13.5 % (ref 11.5–14.5)
WBC: 10.5 10*3/uL (ref 3.8–10.6)

## 2013-07-16 LAB — TROPONIN I: Troponin-I: <0.02 ng/mL

## 2013-07-23 ENCOUNTER — Non-Acute Institutional Stay (SKILLED_NURSING_FACILITY): Payer: Medicare Other | Admitting: Nurse Practitioner

## 2013-07-23 ENCOUNTER — Encounter: Payer: Self-pay | Admitting: Nurse Practitioner

## 2013-07-23 DIAGNOSIS — E039 Hypothyroidism, unspecified: Secondary | ICD-10-CM | POA: Insufficient documentation

## 2013-07-23 DIAGNOSIS — I6990 Unspecified sequelae of unspecified cerebrovascular disease: Secondary | ICD-10-CM

## 2013-07-23 DIAGNOSIS — I639 Cerebral infarction, unspecified: Secondary | ICD-10-CM | POA: Insufficient documentation

## 2013-07-23 DIAGNOSIS — I6350 Cerebral infarction due to unspecified occlusion or stenosis of unspecified cerebral artery: Secondary | ICD-10-CM

## 2013-07-23 DIAGNOSIS — I10 Essential (primary) hypertension: Secondary | ICD-10-CM

## 2013-07-23 NOTE — Progress Notes (Signed)
Patient ID: Ryan Cantu, male   DOB: 1923-11-30, 79 y.o.   MRN: [id number]  07/28/2013.  History of present illness: On October 30, patient presented to a local hospital with complaint of dizziness, nausea, vomiting. Went to the emergency room at a local hospital, CT scan was reportedly within normal limits. He was given meclizine and sent home. On October 31 at 7 AM noted that the patient had difficulty getting out of bed secondary to right leg weakness. Early in that afternoon, patient's daughter noted a right facial droop, and patient complained of the funny feeling in his right arm. No reported seizures. Subsequently, the patient was transported to [facility].  Was out of time window for IV tPA and previous history of ICH.    Chief Complaint: Residual right-sided weakness, particularly right lower extremity.  Past medical history includes left intracerebral hemorrhage in 2010 Hypertension Hypothyroidism Atherosclerotic vascular disease  Review of systems: Gen. denies any pain. Neurological, residual right-sided weakness denies any headache Cardiovascular no chest pain reported Respiratory, no reported shortness of breath Abdominal/GI no complaint of constipation or abdominal pain Peripheral vascular, no complaint of any symptoms consistent with claudication.  Imaging at [facility]: CT of the head: Encephalomalacia inferiorly left frontal lobe with ex vacuo dilation to the anterior left ventricle.Focal hypointensity within the superior frontal lobe with calcifications tracking into it may represent a prior burr hole.   CTA head and neck Moderate to severe narrowing of the distal left MI segment just before the trifurcation.   MRI of the brain 10/31 acute left pontine infarct Resting echocardiogram showed ejection fraction of 55%, moderate left ventricular hypertrophy, no valvular stenosis, mild aortic regurgitation, trivial mitral  regurgitation, mild tricuspid regurgitation. Saline microcavitation study normal. Extensive atherosclerotic disease of his left MCA, right ICA, and right PCA. The infarction of the pons is likely from occlusion of a perforating artery.  Lab studies: 07/17/2013 Sodium 135 Potassium 4.2 Chloride 11 CO2 26 BUN 11 Creatinine 1.0 Glucose 107 Calcium 8.8  WBCs 10.1 Hemoglobin 15.7 Hematocrit 47 Platelets 201  INR 1.1 8 PTT 45.5  Labs 07/18/13: Sodium 139 Potassium 3.5 Chloride 104 CO2 27 BUN 11 Creatinine 0.8 Glucose 90 Calcium 8.8  07/19/13 Sodium 142 Potassium 3.7 Chloride 107 CO2 28 9 BUN 11 Creatinine 0.8 Glucose 86 Calcium 8.8  APTT 47.7 INR 1.1  Vital signs: Blood pressure 115/80 Respiratory rate 22 Pulse 69 Pulse oximetry 95%  Physical examination: Gen. patient is alert, verbally appropriate, neat and well groomed, in no apparent distress Neurological patient is alert and oriented x3, and was able to do questions without difficulty, Question whether slight slurring of speech, otherwise unremarkable. There appears to be no facial asymmetry. Eyes, pupils are equally round and reactive to light, unable to fully visualize optic discs. Positive for arcus senilis. Conjunctiva is pink. Ears, TMs are unremarkable. Oropharynx, teeth are in excellent repair. No cervical adenopathy, no JVD, no palpable thyromegaly. Carotid bruits not evident. Apical pulse with only intermittent irregularity, otherwise unremarkable. No distinct murmur appreciated. Bilateral breath sounds and currently completely clear. Positive bowel sounds, abdomen is soft and nontender. Lower extremities without any significant edema. No edema noted at medial ankles. Posterior tibial and pedal dorsalis pulses are appreciated. He does have apparent weakness in the right leg, decreased ability in strength to elevate the right leg intermittent equal to his left leg. His right and left hand grip  strength appear equal.  Status post CVA, major disability related to right  lower extremity. Patient will need extensive physical/occupational therapy to regain independence and safety with ambulation and performance of ADLs. Hypertension, the patient will continue on his lisinopril. Risk for DVT, patient will continue on the Lovenox for approximately 2 weeks. We'll also continue the aspirin at 81 mg. We'll need to discuss with his neurologist whether he needs to be on any other meds after the Lovenox such as Aggrenox. Hypothyroidism, we'll certainly continue his current dose of levothyroxine and likely reassess that value in one month. Patient with hematocrit of 47, will likely reassess in 2-3 weeks. Patient's renal function is very stable, we'll reassess in one month. Hyperlipidemia, with the patient's history of atherosclerotic disease prompting recent stroke, it is important that the patient remain on the Lipitor. We have no test results showing hepatic function. It is concerning that the patient is on 80 mg of Lipitor, will need to assess. Will draw hepatic profile on 07/24/13 a.m.

## 2013-07-30 ENCOUNTER — Non-Acute Institutional Stay (SKILLED_NURSING_FACILITY): Payer: Medicare Other | Admitting: Internal Medicine

## 2013-07-30 DIAGNOSIS — I10 Essential (primary) hypertension: Secondary | ICD-10-CM

## 2013-07-30 DIAGNOSIS — I6350 Cerebral infarction due to unspecified occlusion or stenosis of unspecified cerebral artery: Secondary | ICD-10-CM

## 2013-07-30 DIAGNOSIS — I639 Cerebral infarction, unspecified: Secondary | ICD-10-CM

## 2013-07-30 DIAGNOSIS — E039 Hypothyroidism, unspecified: Secondary | ICD-10-CM

## 2013-07-30 DIAGNOSIS — K219 Gastro-esophageal reflux disease without esophagitis: Secondary | ICD-10-CM

## 2013-07-30 DIAGNOSIS — R42 Dizziness and giddiness: Secondary | ICD-10-CM

## 2013-07-30 NOTE — Progress Notes (Signed)
Patient ID: Ryan Cantu, male   DOB: 1924/02/28, 79 y.o.   MRN: [id number]  [facility]   Code Status: DNR  Allergies  Allergen Reactions  . Sulfa Antibiotics     Chief Complaint: new admit  HPI:  79 y/o male patient is here for STR after hospital admission from 07/17/13- 07/21/13 with stroke. He had left sided hemorrhagic stroke with IVH in 2010 requiring EVD placement and recently had right facial droop, dysarthria and right arm drift after which he went to the ED. Ct head showed no new changes but MRI brain showed pontine infarct on left side. Neurology was consulted. No tPA was given as he was outside the window period. He was started on baby aspirin and lipitor 80 mg daily. Thrombus was ruled out by echocardiogram. Lisinopril 5 mg daily was started and PT/OT was initiated. He is here for STR.  He was seen in his room today sitting on his chair and watching television. He denies any complaints. He has been working well with therapy. No falls reported. No skin concerns  Review of Systems  Constitutional: Negative for fever, chills, weight loss, malaise/fatigue and diaphoresis.  HENT: Negative for congestion, hearing loss and sore throat.   Eyes: Negative for blurred vision, double vision and discharge.  Respiratory: Negative for cough, sputum production, shortness of breath and wheezing.   Cardiovascular: Negative for chest pain, palpitations, orthopnea and leg swelling.  Gastrointestinal: Negative for heartburn, nausea, vomiting, abdominal pain, diarrhea and constipation.  Genitourinary: Negative for dysuria, urgency, frequency and flank pain.  Musculoskeletal: Negative for back pain, falls, joint pain and myalgias.  Skin: Negative for itching and rash.  Neurological: Positive for weakness and dizziness with sudden position changes, tingling, focal weakness and headaches.  Psychiatric/Behavioral: Negative for depression and memory loss. The patient is not nervous/anxious.      Past Medical History  Diagnosis Date  . CVA (cerebrovascular accident due to intracerebral hemorrhage) 2010  . cva 2014, 10/30    Right-sided facial droop, and right extremities weakness.  . Hypertension   . Hypothyroidism    No past surgical history on file. Social History:   reports that he has never smoked. He has never used smokeless tobacco. He reports that he does not drink alcohol or use illicit drugs.  No family history on file.  Medications: Patient's Medications  New Prescriptions   No medications on file  Previous Medications   ASPIRIN 81 MG TABLET    Take 81 mg by mouth daily.   ATORVASTATIN (LIPITOR) 80 MG TABLET    Take 80 mg by mouth daily.   ENOXAPARIN (LOVENOX) 40 MG/0.4ML INJECTION    Inject 40 mg into the skin daily.   LEVOTHYROXINE (SYNTHROID, LEVOTHROID) 100 MCG TABLET    Take 100 mcg by mouth daily before breakfast.   LISINOPRIL (PRINIVIL,ZESTRIL) 5 MG TABLET    Take 5 mg by mouth daily.   MECLIZINE (ANTIVERT) 12.5 MG TABLET    Take 12.5 mg by mouth 3 (three) times daily as needed for dizziness.   OMEPRAZOLE (PRILOSEC) 10 MG CAPSULE    Take 10 mg by mouth daily.   ONDANSETRON (ZOFRAN) 4 MG TABLET    Take 4 mg by mouth every 8 (eight) hours as needed for nausea or vomiting.  Modified Medications   No medications on file  Discontinued Medications   OMEPRAZOLE-SODIUM BICARBONATE (ZEGERID) 40-1100 MG PER CAPSULE    Take 1 capsule by mouth daily before breakfast.   PANTOPRAZOLE (PROTONIX) 40  MG TABLET    Take 40 mg by mouth daily.   SENNA (SENOKOT) 8.6 MG TABLET    Take 1 tablet by mouth daily.     Physical Exam:  Filed Vitals:   07/30/13 1428  BP: 148/74  Pulse: 61  Temp: 96.6 F (35.9 C)  Resp: 18  SpO2: 96%   General- elderly male in no acute distress Head- atraumatic, normocephalic Eyes- PERRLA, EOMI, no pallor, no icterus Neck- no lymphadenopathy, no thyromegaly, no jugular vein distension, no carotid bruit Chest- no chest wall  deformities, no chest wall tenderness Cardiovascular- normal s1,s2, no murmurs/ rubs/ gallops Respiratory- bilateral clear to auscultation, no wheeze, no rhonchi, no crackles Abdomen- bowel sounds present, soft, non tender, no organomegaly, no abdominal bruits, no guarding or rigidity, no CVA tenderness Musculoskeletal- able to move all 4 extremities, no spinal and paraspinal tenderness, right sided weakness (some present), using a walker Neurological- no focal deficit Psychiatry- alert and oriented to person, place and time, normal mood and affect   Labs reviewed: See medical records 07/18/13 a1c 5.7, ldl 81, hdl 25, tg 94  Radiological Exams: CT of the head: no acute intracranila abnormalities  MRI of the brain: acute left pontine infarct  Echocardiogram: ejection fraction of 55%, moderate left ventricular hypertrophy, no valvular stenosis, mild aortic regurgitation, trivial mitral regurgitation, mild tricuspid regurgitation.   CTA head and neck reviewed   Assessment/Plan  Acute cva- recent acute left sided pontine infarct with right sided weakness. Working with PT and OT and making imporvement in terms of muscle strength. Continue aspirin and atorvastatin. Fall precautions and gait training. Regular diet with thin liquids for now and will have SLP team evaluate patient. Pt is moving around with a walker. Will d/c lovenox for now  Dizziness- likely from autonomic dysfunction. Will continue meclizine 12.5 mg tidprn for now and slow position change, fall precautions  Hypothyroidism- continue levothyroxine 100 mcg daily, monitor clinically  gerd- symptoms under control, continue omeprazole 10 mg daily  Family/ staff Communication: reviewed care plan with patient and nursing supervisor  Goals of care: to return home   Labs/tests ordered: cbc, cmp

## 2013-08-07 ENCOUNTER — Non-Acute Institutional Stay (SKILLED_NURSING_FACILITY): Payer: Medicare Other | Admitting: Nurse Practitioner

## 2013-08-07 DIAGNOSIS — I6990 Unspecified sequelae of unspecified cerebrovascular disease: Secondary | ICD-10-CM

## 2013-08-07 DIAGNOSIS — I6350 Cerebral infarction due to unspecified occlusion or stenosis of unspecified cerebral artery: Secondary | ICD-10-CM

## 2013-08-07 DIAGNOSIS — I639 Cerebral infarction, unspecified: Secondary | ICD-10-CM

## 2013-08-07 DIAGNOSIS — E039 Hypothyroidism, unspecified: Secondary | ICD-10-CM

## 2013-09-29 NOTE — Progress Notes (Signed)
Date of Visit 08/07/2013   Patient ID: Ryan Cantu, male   DOB: [birth year], 80 y.o.   MRN: [id number]  Allergies  Allergen Reactions  . Sulfa Antibiotics    Chief Complaint  Patient presents with  . Discharge Note    History of present illness: On October 30, patient presented to a local hospital with complaint of dizziness, nausea, vomiting. Went to the emergency room at a local hospital, CT scan was reportedly within normal limits. He was given meclizine and sent home. On October 31 at 7 AM noted that the patient had difficulty getting out of bed secondary to right leg weakness. Early in that afternoon, patient's daughter noted a right facial droop, and patient complained of the funny feeling in his right arm. No reported seizures. Subsequently, the patient was transported to [facility]   Was out of time window for IV tPA and previous history of ICH.    Now, pt is ready for d/c, walking independently with a walker, stating he feels very well.  Past medical history includes left intracerebral hemorrhage in 2010 Hypertension Hypothyroidism Atherosclerotic vascular disease  Review of systems: Gen. denies any pain. Neurological, residual right-sided weakness denies any headache Cardiovascular no chest pain reported Respiratory, no reported shortness of breath Abdominal/GI no complaint of constipation or abdominal pain Peripheral vascular, no complaint of any symptoms consistent with claudication.  Imaging at [facility]: CT of the head: Encephalomalacia inferiorly left frontal lobe with ex vacuo dilation to the anterior left ventricle.Focal hypointensity within the superior frontal lobe with calcifications tracking into it may represent a prior burr hole.   CTA head and neck Moderate to severe narrowing of the distal left MI segment just before the trifurcation.   MRI of the brain 10/31 acute left pontine infarct Resting  echocardiogram showed ejection fraction of 55%, moderate left ventricular hypertrophy, no valvular stenosis, mild aortic regurgitation, trivial mitral regurgitation, mild tricuspid regurgitation. Saline microcavitation study normal. Extensive atherosclerotic disease of his left MCA, right ICA, and right PCA. The infarction of the pons is likely from occlusion of a perforating artery.  Lab studies: 07/17/2013 Sodium 135 Potassium 4.2 Chloride 11 CO2 26 BUN 11 Creatinine 1.0 Glucose 107 Calcium 8.8  WBCs 10.1 Hemoglobin 15.7 Hematocrit 47 Platelets 201  INR 1.1 8 PTT 45.5  Labs 07/18/13: Sodium 139 Potassium 3.5 Chloride 104 CO2 27 BUN 11 Creatinine 0.8 Glucose 90 Calcium 8.8  07/19/13 Sodium 142 Potassium 3.7 Chloride 107 CO2 28 9 BUN 11 Creatinine 0.8 Glucose 86 Calcium 8.8  APTT 47.7 INR 1.1  07/24/1999 WBC 9.7 her RBC 5.6 Hemoglobin 16 point 97.1 MCH 30.3 MCHC 31.2 RDW 14.1 Platelets 273 Sodium 135 1 potassium 4.4 Chloride 98 CO2 28 AGAP 9 Glucose 90 Creatinine 0.8  Calcium 9.6  07/24/2013 Total protein 6.4 Albumin 4 GLOB 2.4 Total bilirubin 1.4 Direct bilirubin 0.3 ALP 77 AST 43 ALT 45  Vital signs: Blood pressure 124/66 Respiratory rate 18 Pulse 70 Pulse oximetry 96%  Physical examination: Gen. patient is alert, verbally appropriate, neat and well groomed, in no apparent distress Neurological patient is alert and oriented x3, and was able to do questions without difficulty, Question whether slight slurring of speech, otherwise unremarkable. There appears to be no facial asymmetry. Eyes, pupils are equally round and reactive to light, unable to fully visualize optic discs. Positive for arcus senilis. Conjunctiva is pink. Ears, TMs are unremarkable. Oropharynx, teeth are in excellent repair. No cervical adenopathy, no JVD, no palpable  thyromegaly. Carotid bruits not evident. Apical pulse with only intermittent irregularity, otherwise  unremarkable. No distinct murmur appreciated. Bilateral breath sounds and currently completely clear. Positive bowel sounds, abdomen is soft and nontender. Lower extremities without any significant edema. No edema noted at medial ankles. Posterior tibial and pedal dorsalis pulses are appreciated. He does have apparent weakness in the right leg, decreased ability in strength to elevate the right leg intermittent equal to his left leg. His right and left hand grip strength appear equal.  Assessment/plan:  Status post CVA, major disability related to right lower extremity. The patient is certainly ready for discharge. He will need ongoing physical therapy and occupational therapy to continue strengthening, mobility, safety, and adaptation of ADLs.  Hypertension, the patient will continue on his lisinopril.  Risk for DVT, patient will continue on the Lovenox for approximately 2 weeks. We'll also continue the aspirin at 81 mg.   Hypothyroidism, we'll certainly continue his current dose of levothyroxine  Patient with hematocrit of 47 Patient's renal function is very stable,   Hyperlipidemia, with the patient's history of atherosclerotic disease prompting recent stroke, it is important that the patient remain on the Lipitor.  Patient will have ongoing followup with his primary care provider and other specialists.

## 2015-05-17 ENCOUNTER — Other Ambulatory Visit: Payer: Self-pay | Admitting: Internal Medicine

## 2015-05-17 DIAGNOSIS — R55 Syncope and collapse: Secondary | ICD-10-CM

## 2015-05-18 ENCOUNTER — Encounter: Payer: Self-pay | Admitting: Emergency Medicine

## 2015-05-18 ENCOUNTER — Emergency Department: Payer: Medicare Other

## 2015-05-18 ENCOUNTER — Inpatient Hospital Stay
Admission: EM | Admit: 2015-05-18 | Discharge: 2015-05-23 | DRG: 149 | Disposition: A | Discharge status: Skilled Nursing Facility | Payer: Medicare Other | Attending: Internal Medicine | Admitting: Internal Medicine

## 2015-05-18 DIAGNOSIS — I69351 Hemiplegia and hemiparesis following cerebral infarction affecting right dominant side: Secondary | ICD-10-CM

## 2015-05-18 DIAGNOSIS — R51 Headache: Secondary | ICD-10-CM

## 2015-05-18 DIAGNOSIS — Z882 Allergy status to sulfonamides status: Secondary | ICD-10-CM

## 2015-05-18 DIAGNOSIS — Z7982 Long term (current) use of aspirin: Secondary | ICD-10-CM

## 2015-05-18 DIAGNOSIS — I639 Cerebral infarction, unspecified: Secondary | ICD-10-CM | POA: Diagnosis present

## 2015-05-18 DIAGNOSIS — G9389 Other specified disorders of brain: Secondary | ICD-10-CM | POA: Diagnosis present

## 2015-05-18 DIAGNOSIS — Z79899 Other long term (current) drug therapy: Secondary | ICD-10-CM

## 2015-05-18 DIAGNOSIS — R42 Dizziness and giddiness: Principal | ICD-10-CM | POA: Diagnosis present

## 2015-05-18 DIAGNOSIS — R55 Syncope and collapse: Secondary | ICD-10-CM

## 2015-05-18 DIAGNOSIS — K219 Gastro-esophageal reflux disease without esophagitis: Secondary | ICD-10-CM | POA: Diagnosis present

## 2015-05-18 DIAGNOSIS — R29898 Other symptoms and signs involving the musculoskeletal system: Secondary | ICD-10-CM | POA: Diagnosis present

## 2015-05-18 DIAGNOSIS — Z8673 Personal history of transient ischemic attack (TIA), and cerebral infarction without residual deficits: Secondary | ICD-10-CM

## 2015-05-18 DIAGNOSIS — H9192 Unspecified hearing loss, left ear: Secondary | ICD-10-CM | POA: Diagnosis present

## 2015-05-18 DIAGNOSIS — R531 Weakness: Secondary | ICD-10-CM | POA: Diagnosis not present

## 2015-05-18 DIAGNOSIS — I10 Essential (primary) hypertension: Secondary | ICD-10-CM | POA: Diagnosis present

## 2015-05-18 DIAGNOSIS — E039 Hypothyroidism, unspecified: Secondary | ICD-10-CM | POA: Diagnosis present

## 2015-05-18 DIAGNOSIS — E785 Hyperlipidemia, unspecified: Secondary | ICD-10-CM | POA: Diagnosis present

## 2015-05-18 DIAGNOSIS — R519 Headache, unspecified: Secondary | ICD-10-CM

## 2015-05-18 HISTORY — DX: Gastro-esophageal reflux disease without esophagitis: K21.9

## 2015-05-18 LAB — CBC WITH DIFFERENTIAL/PLATELET
BASOS PCT: 1 %
Basophils Absolute: 0.1 10*3/uL (ref 0–0.1)
Eosinophils Absolute: 0.2 10*3/uL (ref 0–0.7)
Eosinophils Relative: 2 %
HEMATOCRIT: 48.9 % (ref 40.0–52.0)
HEMOGLOBIN: 15.8 g/dL (ref 13.0–18.0)
LYMPHS PCT: 20 %
Lymphs Abs: 2.1 10*3/uL (ref 1.0–3.6)
MCH: 30.8 pg (ref 26.0–34.0)
MCHC: 32.4 g/dL (ref 32.0–36.0)
MCV: 95.3 fL (ref 80.0–100.0)
MONOS PCT: 7 %
Monocytes Absolute: 0.7 10*3/uL (ref 0.2–1.0)
NEUTROS ABS: 7.5 10*3/uL — AB (ref 1.4–6.5)
NEUTROS PCT: 70 %
Platelets: 218 10*3/uL (ref 150–440)
RBC: 5.13 MIL/uL (ref 4.40–5.90)
RDW: 14.1 % (ref 11.5–14.5)
WBC: 10.7 10*3/uL — ABNORMAL HIGH (ref 3.8–10.6)

## 2015-05-18 LAB — COMPREHENSIVE METABOLIC PANEL
ALBUMIN: 4 g/dL (ref 3.5–5.0)
ALK PHOS: 87 U/L (ref 38–126)
ALT: 24 U/L (ref 17–63)
ANION GAP: 10 (ref 5–15)
AST: 28 U/L (ref 15–41)
BILIRUBIN TOTAL: 0.8 mg/dL (ref 0.3–1.2)
BUN: 17 mg/dL (ref 6–20)
CALCIUM: 8.9 mg/dL (ref 8.9–10.3)
CO2: 27 mmol/L (ref 22–32)
Chloride: 100 mmol/L — ABNORMAL LOW (ref 101–111)
Creatinine, Ser: 0.86 mg/dL (ref 0.61–1.24)
GFR calc Af Amer: >60 mL/min (ref >60)
GFR calc non Af Amer: >60 mL/min (ref >60)
GLUCOSE: 100 mg/dL — AB (ref 65–99)
Potassium: 3.9 mmol/L (ref 3.5–5.1)
Sodium: 137 mmol/L (ref 135–145)
TOTAL PROTEIN: 6.9 g/dL (ref 6.5–8.1)

## 2015-05-18 LAB — TROPONIN I: Troponin I: <0.03 ng/mL (ref <0.031)

## 2015-05-18 NOTE — ED Notes (Signed)
Pt to ED from home via EMS c/o weakness.  Pt states went to church tonight and had a "spell like I was weak but it got better", while at home pt when to restroom and then sat down in recliner and felt pressure to head, onset difficulty hearing in bilateral ears, and unable to stand back up.  EMS reports standing pt up and was very weak which family states is not normal.  Pt typically walks independently at home.  Pt PCP is Dr. Hyacinth Meeker, pt wore holter monitor on Monday, pt has hx of 2 strokes which have affected speech and balance.  Pt is A&Ox4, speaking in complete and coherent sentences and in NAD at this time.

## 2015-05-18 NOTE — ED Provider Notes (Signed)
[facility] Emergency Department Provider Note  ____________________________________________  Time seen: Approximately 11:22 PM  I have reviewed the triage vital signs and the nursing notes.   HISTORY  Chief Complaint Weakness    HPI Lior Hoen is a 81 y.o. male who presents to the ED from home via EMS with a chief complaint of weakness. Patient states he went to church tonight approximately 6 PM and had a "spell". States he has had increasing "spells" and wore a Holter monitor per PCP on Monday. He is scheduled for carotid Dopplers later this week. Patient has a history of 2 prior CVAs. At church, patient describes head pressure, feeling lightheaded with diminished hearing in his left ear. When he stood to ambulate, he felt wobbly and off balance and nearly collapsed. He felt better and went home. At home patient went to the restroom and was unable to get up secondary to weakness in his legs, particularly the right leg.Ambulates independently usually; states occasionally he uses a walker in the mornings only. Denies recent fever, chills, chest pain, shortness of breath, abdominal pain, nausea, vomiting, diarrhea.   Past Medical History  Diagnosis Date  . CVA (cerebrovascular accident due to intracerebral hemorrhage) 2010  . cva 2014, 10/30    Right-sided facial droop, and right extremities weakness.  . Hypertension   . Hypothyroidism     Patient Active Problem List   Diagnosis Date Noted  . Dizziness 07/30/2013  . GERD (gastroesophageal reflux disease) 07/30/2013  . CVA (cerebral vascular accident) 07/23/2013  . Unspecified hypothyroidism 07/23/2013  . Unspecified essential hypertension 07/23/2013  . Late effects of CVA (cerebrovascular accident) 07/23/2013    History reviewed. No pertinent past surgical history.  Current Outpatient Rx  Name  Route  Sig  Dispense  Refill  . atorvastatin (LIPITOR) 10 MG tablet   Oral   Take 10 mg by mouth daily.          . fluticasone (FLONASE) 50 MCG/ACT nasal spray   Nasal   Place 2 sprays into the nose daily.         . isosorbide mononitrate (IMDUR) 30 MG 24 hr tablet   Oral   Take 1 tablet by mouth daily.         Marland Kitchen loratadine (CLARITIN) 10 MG tablet   Oral   Take 10 mg by mouth daily.         Marland Kitchen omeprazole (PRILOSEC) 20 MG capsule   Oral   Take 20 mg by mouth 2 (two) times daily before a meal.          . aspirin 81 MG tablet   Oral   Take 81 mg by mouth daily.         Marland Kitchen levothyroxine (SYNTHROID, LEVOTHROID) 100 MCG tablet   Oral   Take 100 mcg by mouth daily before breakfast.         . meclizine (ANTIVERT) 12.5 MG tablet   Oral   Take 12.5 mg by mouth 3 (three) times daily as needed for dizziness.         . ondansetron (ZOFRAN) 4 MG tablet   Oral   Take 4 mg by mouth every 8 (eight) hours as needed for nausea or vomiting.           Allergies Sulfa antibiotics  History reviewed. No pertinent family history.  Social History Social History  Substance Use Topics  . Smoking status: Never Smoker   . Smokeless tobacco: Never Used  . Alcohol  Use: No    Review of Systems Constitutional: No fever/chills Eyes: No visual changes. ENT: No sore throat. Cardiovascular: Denies chest pain. Respiratory: Denies shortness of breath. Gastrointestinal: No abdominal pain.  No nausea, no vomiting.  No diarrhea.  No constipation. Genitourinary: Negative for dysuria. Musculoskeletal: Negative for back pain. Skin: Negative for rash. Neurological: Positive for headache and lightheadedness.  10-point ROS otherwise negative.  ____________________________________________   PHYSICAL EXAM:  VITAL SIGNS: ED Triage Vitals  Enc Vitals Group     BP 05/18/15 2222 201/99 mmHg     Pulse Rate 05/18/15 2222 56     Resp 05/18/15 2222 18     Temp 05/18/15 2222 97.6 F (36.4 C)     Temp Source 05/18/15 2222 Oral     SpO2 05/18/15 2222 96 %     Weight 05/18/15 2222 192 lb  (87.091 kg)     Height 05/18/15 2222 5\' 9"  (1.753 m)     Head Cir --      Peak Flow --      Pain Score --      Pain Loc --      Pain Edu? --      Excl. in GC? --     Constitutional: Alert and oriented. Well appearing and in mild acute distress. Eyes: Conjunctivae are normal. PERRL. EOMI. Head: Atraumatic. Nose: No congestion/rhinnorhea. Mouth/Throat: Mucous membranes are moist.  Oropharynx non-erythematous. Neck: No stridor. No carotid bruits.  Cardiovascular: Normal rate, regular rhythm. Grossly normal heart sounds.  Good peripheral circulation. Respiratory: Normal respiratory effort.  No retractions. Lungs CTAB. Gastrointestinal: Soft and nontender. No distention. No abdominal bruits. No CVA tenderness. Musculoskeletal: No lower extremity tenderness nor edema.  No joint effusions. Neurologic:  Normal speech and language. CN II-12 grossly intact. Symmetrical strength and sensation BUE. 3/5 RLE weakness which patient feels is weaker than baseline. Skin:  Skin is warm, dry and intact. No rash noted. Psychiatric: Mood and affect are normal. Speech and behavior are normal.  ____________________________________________   LABS (all labs ordered are listed, but only abnormal results are displayed)  Labs Reviewed  CBC WITH DIFFERENTIAL/PLATELET - Abnormal; Notable for the following:    WBC 10.7 (*)    Neutro Abs 7.5 (*)    All other components within normal limits  COMPREHENSIVE METABOLIC PANEL - Abnormal; Notable for the following:    Chloride 100 (*)    Glucose, Bld 100 (*)    All other components within normal limits  TROPONIN I  URINALYSIS COMPLETEWITH MICROSCOPIC ([facility] ONLY)   ____________________________________________  EKG  ED ECG REPORT I, Rondi Ivy J, the attending physician, personally viewed and interpreted this ECG.   Date: 05/18/2015  EKG Time: 2216  Rate: 54  Rhythm: sinus bradycardia  Axis: Normal  Intervals:none  ST&T Change:  Nonspecific  ____________________________________________  RADIOLOGY  CT Head without contrast interpreted per Dr. Cherly Hensen: 1. No acute intracranial pathology seen on CT. 2. Mild to moderate cortical volume loss and scattered small vessel ischemic microangiopathy. 3. Chronic infarct at the left basal ganglia, with ex vacuo dilatation of the frontal horn of the left lateral ventricle. ____________________________________________   PROCEDURES  Procedure(s) performed: None  Critical Care performed: No  ____________________________________________   INITIAL IMPRESSION / ASSESSMENT AND PLAN / ED COURSE  Pertinent labs & imaging results that were available during my care of the patient were reviewed by me and considered in my medical decision making (see chart for details).  81 year old male with a past medical history of  2 prior CVAs who presents with near syncopal episode and episode of weakness in which she was unable to get up without heavy assistance. Symptoms concerning for CVA. Will obtain a noncontrast CT head, screening lab work; anticipate admission.  ----------------------------------------- 1:15 AM on 05/19/2015 -----------------------------------------  Updated family of negative CT Head results. Will order anticoagulation and discuss with hospitalist. ____________________________________________   FINAL CLINICAL IMPRESSION(S) / ED DIAGNOSES  Final diagnoses:  Weakness  Cerebral infarction due to unspecified mechanism  Near syncope      Irean Hong, MD 05/19/15 [phone]

## 2015-05-19 ENCOUNTER — Inpatient Hospital Stay
Admit: 2015-05-19 | Discharge: 2015-05-19 | Disposition: A | Discharge status: Home / Self Care | Payer: Medicare Other | Attending: Internal Medicine | Admitting: Internal Medicine

## 2015-05-19 ENCOUNTER — Emergency Department: Payer: Medicare Other

## 2015-05-19 ENCOUNTER — Inpatient Hospital Stay: Payer: Medicare Other

## 2015-05-19 ENCOUNTER — Encounter: Payer: Self-pay | Admitting: Internal Medicine

## 2015-05-19 DIAGNOSIS — K219 Gastro-esophageal reflux disease without esophagitis: Secondary | ICD-10-CM | POA: Diagnosis present

## 2015-05-19 DIAGNOSIS — Z7982 Long term (current) use of aspirin: Secondary | ICD-10-CM | POA: Diagnosis not present

## 2015-05-19 DIAGNOSIS — G9389 Other specified disorders of brain: Secondary | ICD-10-CM | POA: Diagnosis present

## 2015-05-19 DIAGNOSIS — H9192 Unspecified hearing loss, left ear: Secondary | ICD-10-CM | POA: Diagnosis present

## 2015-05-19 DIAGNOSIS — R531 Weakness: Secondary | ICD-10-CM | POA: Diagnosis present

## 2015-05-19 DIAGNOSIS — I69351 Hemiplegia and hemiparesis following cerebral infarction affecting right dominant side: Secondary | ICD-10-CM | POA: Diagnosis not present

## 2015-05-19 DIAGNOSIS — E039 Hypothyroidism, unspecified: Secondary | ICD-10-CM | POA: Diagnosis present

## 2015-05-19 DIAGNOSIS — R29898 Other symptoms and signs involving the musculoskeletal system: Secondary | ICD-10-CM | POA: Diagnosis present

## 2015-05-19 DIAGNOSIS — Z8673 Personal history of transient ischemic attack (TIA), and cerebral infarction without residual deficits: Secondary | ICD-10-CM

## 2015-05-19 DIAGNOSIS — I10 Essential (primary) hypertension: Secondary | ICD-10-CM | POA: Diagnosis present

## 2015-05-19 DIAGNOSIS — R42 Dizziness and giddiness: Secondary | ICD-10-CM | POA: Diagnosis present

## 2015-05-19 DIAGNOSIS — Z79899 Other long term (current) drug therapy: Secondary | ICD-10-CM | POA: Diagnosis not present

## 2015-05-19 DIAGNOSIS — E785 Hyperlipidemia, unspecified: Secondary | ICD-10-CM | POA: Diagnosis present

## 2015-05-19 DIAGNOSIS — Z882 Allergy status to sulfonamides status: Secondary | ICD-10-CM | POA: Diagnosis not present

## 2015-05-19 LAB — URINALYSIS COMPLETE WITH MICROSCOPIC (ARMC ONLY)
BILIRUBIN URINE: NEGATIVE
Bacteria, UA: NONE SEEN
Glucose, UA: NEGATIVE mg/dL
Hgb urine dipstick: NEGATIVE
KETONES UR: NEGATIVE mg/dL
Leukocytes, UA: NEGATIVE
NITRITE: NEGATIVE
Protein, ur: NEGATIVE mg/dL
SPECIFIC GRAVITY, URINE: 1.01 (ref 1.005–1.030)
Squamous Epithelial / LPF: NONE SEEN
pH: 6 (ref 5.0–8.0)

## 2015-05-19 LAB — SEDIMENTATION RATE: Sed Rate: 2 mm/hr (ref 0–20)

## 2015-05-19 MED ORDER — SODIUM CHLORIDE 0.9 % IJ SOLN
3.0000 mL | Freq: Two times a day (BID) | INTRAMUSCULAR | Status: DC
  Administered 2015-05-19 – 2015-05-22 (×8): 3 mL via INTRAVENOUS

## 2015-05-19 MED ORDER — ONDANSETRON HCL 4 MG PO TABS: 4.0000 mg | ORAL_TABLET | Freq: Four times a day (QID) | ORAL | Status: DC | PRN

## 2015-05-19 MED ORDER — ASPIRIN 81 MG PO TABS: 81.0000 mg | ORAL_TABLET | Freq: Every day | ORAL | Status: DC

## 2015-05-19 MED ORDER — ASPIRIN EC 81 MG PO TBEC
81.0000 mg | DELAYED_RELEASE_TABLET | Freq: Every day | ORAL | Status: DC
  Administered 2015-05-19 – 2015-05-22 (×4): 81 mg via ORAL
  Filled 2015-05-19 (×5): qty 1

## 2015-05-19 MED ORDER — ENOXAPARIN SODIUM 40 MG/0.4ML ~~LOC~~ SOLN
40.0000 mg | Freq: Every day | SUBCUTANEOUS | Status: DC
  Administered 2015-05-19: 12:00:00 40 mg via SUBCUTANEOUS
  Filled 2015-05-19: qty 0.4

## 2015-05-19 MED ORDER — AMLODIPINE BESYLATE 5 MG PO TABS
5.0000 mg | ORAL_TABLET | Freq: Every day | ORAL | Status: DC
  Administered 2015-05-19: 15:00:00 5 mg via ORAL
  Filled 2015-05-19: qty 1

## 2015-05-19 MED ORDER — ATORVASTATIN CALCIUM 10 MG PO TABS
10.0000 mg | ORAL_TABLET | Freq: Every day | ORAL | Status: DC
  Administered 2015-05-19 – 2015-05-22 (×4): 10 mg via ORAL
  Filled 2015-05-19 (×4): qty 1

## 2015-05-19 MED ORDER — ISOSORBIDE MONONITRATE ER 30 MG PO TB24
30.0000 mg | ORAL_TABLET | Freq: Every day | ORAL | Status: DC
  Administered 2015-05-19 – 2015-05-22 (×4): 30 mg via ORAL
  Filled 2015-05-19 (×5): qty 1

## 2015-05-19 MED ORDER — FLUTICASONE PROPIONATE 50 MCG/ACT NA SUSP
1.0000 | Freq: Every day | NASAL | Status: DC
  Administered 2015-05-19: 12:00:00 1 via NASAL
  Filled 2015-05-19: qty 16

## 2015-05-19 MED ORDER — ACETAMINOPHEN 650 MG RE SUPP: 650.0000 mg | Freq: Four times a day (QID) | RECTAL | Status: DC | PRN

## 2015-05-19 MED ORDER — ASPIRIN 81 MG PO CHEW
324.0000 mg | CHEWABLE_TABLET | Freq: Once | ORAL | Status: AC
  Administered 2015-05-19: 324 mg via ORAL
  Filled 2015-05-19: qty 4

## 2015-05-19 MED ORDER — LORATADINE 10 MG PO TABS
10.0000 mg | ORAL_TABLET | Freq: Every day | ORAL | Status: DC
  Administered 2015-05-19 – 2015-05-22 (×4): 10 mg via ORAL
  Filled 2015-05-19 (×5): qty 1

## 2015-05-19 MED ORDER — ONDANSETRON HCL 4 MG PO TABS: 4.0000 mg | ORAL_TABLET | Freq: Three times a day (TID) | ORAL | Status: DC | PRN

## 2015-05-19 MED ORDER — MECLIZINE HCL 12.5 MG PO TABS
12.5000 mg | ORAL_TABLET | Freq: Three times a day (TID) | ORAL | Status: DC | PRN
  Filled 2015-05-19: qty 1

## 2015-05-19 MED ORDER — MECLIZINE HCL 12.5 MG PO TABS
12.5000 mg | ORAL_TABLET | Freq: Three times a day (TID) | ORAL | Status: DC
  Administered 2015-05-19 – 2015-05-20 (×3): 12.5 mg via ORAL
  Filled 2015-05-19 (×3): qty 1

## 2015-05-19 MED ORDER — AMLODIPINE BESYLATE 5 MG PO TABS
5.0000 mg | ORAL_TABLET | Freq: Two times a day (BID) | ORAL | Status: DC
  Administered 2015-05-19 – 2015-05-22 (×7): 5 mg via ORAL
  Filled 2015-05-19 (×8): qty 1

## 2015-05-19 MED ORDER — SODIUM CHLORIDE 0.9 % IV SOLN
INTRAVENOUS | Status: AC
  Administered 2015-05-19: 03:00:00 via INTRAVENOUS

## 2015-05-19 MED ORDER — LEVOTHYROXINE SODIUM 100 MCG PO TABS
100.0000 ug | ORAL_TABLET | Freq: Every day | ORAL | Status: DC
  Administered 2015-05-19 – 2015-05-22 (×4): 100 ug via ORAL
  Filled 2015-05-19 (×6): qty 1

## 2015-05-19 MED ORDER — STROKE: EARLY STAGES OF RECOVERY BOOK
Freq: Once | Status: AC
  Administered 2015-05-19: 03:00:00

## 2015-05-19 MED ORDER — ONDANSETRON HCL 4 MG/2ML IJ SOLN
4.0000 mg | Freq: Four times a day (QID) | INTRAMUSCULAR | Status: DC | PRN
  Administered 2015-05-19: 4 mg via INTRAVENOUS
  Filled 2015-05-19: qty 2

## 2015-05-19 MED ORDER — CLOPIDOGREL BISULFATE 75 MG PO TABS
75.0000 mg | ORAL_TABLET | Freq: Every day | ORAL | Status: DC
  Administered 2015-05-19: 75 mg via ORAL
  Filled 2015-05-19: qty 1

## 2015-05-19 MED ORDER — ACETAMINOPHEN 325 MG PO TABS: 650.0000 mg | ORAL_TABLET | Freq: Four times a day (QID) | ORAL | Status: DC | PRN

## 2015-05-19 MED ORDER — PANTOPRAZOLE SODIUM 40 MG PO TBEC
40.0000 mg | DELAYED_RELEASE_TABLET | Freq: Every day | ORAL | Status: DC
  Administered 2015-05-19 – 2015-05-22 (×4): 40 mg via ORAL
  Filled 2015-05-19 (×6): qty 1

## 2015-05-19 NOTE — Progress Notes (Signed)
Speech Therapy Note: received order, consulted NSG and reviewed chart notes. Unable to see pt this AM d/t pt being out of room for tests. Pt awake, sitting in chair in room w/ family members present. Discussed w/ pt and girlfriend/Dtr if there were any issues w/ speech-language deficits or trouble swallowing. Both pt and family members denied any deficits in these areas. Pt has tolerated meals and meds today w/out overt s/s of aspiration; pt conversed in conversation w/ family and SLP. ST will hold on evaluation at this time and will f/u in AM for any further discussion or assessment. Pt and family members agreed. NSG updated.

## 2015-05-19 NOTE — Plan of Care (Signed)
Problem: Discharge/Transitional Outcomes Goal: Other Discharge Outcomes/Goals 1. No c/o pain or discomfort.  2. Hemodynamically:             -VSS, afebrile, sinus brady on telemetry              -Neuro & VS Q2H, NIH Qshift  3. NPO sips w/ meds. Passed bedside swallow. Dr. Betti Cruz wanted to keep pt NPO until dayshift 4. +1 assist out of bed. Generalized weakness w/ leg lower extremity worst. PT consulted. Will continue to assess.

## 2015-05-19 NOTE — H&P (Signed)
[facility] - [place] at [facility]   PATIENT NAME: Ryan Cantu    MR#:  [id number]  DATE OF BIRTH:  31-Jul-1924  DATE OF ADMISSION:  05/18/2015  PRIMARY CARE PHYSICIAN: Oneal Grout, MD Dr. Bethann Punches of [facility] clinic  REQUESTING/REFERRING PHYSICIAN: Chiquita Loth  CHIEF COMPLAINT:   Chief Complaint  Patient presents with  . Weakness   weakness of right lower extremity with inability to get up and stand  HISTORY OF PRESENT ILLNESS:  Ryan Cantu  is a 81 y.o. 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 Patient mentions that he has been having spells of dizziness ongoing for the past 2 weeks for which workup is in progress and had a Holter monitor placed on Monday. Today while he was at church he developed extreme weakness with associated pressure in the head and loss of hearing on the left ear. Later in the evening while he was at home, he noted weakness of right lower extremity with inability to get up from the recliner and stand up hence was brought to the emergency room for further evaluation. Denies any speech or swallow difficulties. History of prior stroke 2 with near complete recovery and walks without a cane at home. Denies any associated shortness of breath, chest pain, fever, chills, nausea, vomiting, diarrhea, dysuria. Evaluation in the ED revealed a  elevated systolic blood pressure and neuro examination was remarkable for right leg weakness. Patient is also having trouble hearing from his right ear. Lab work revealed CBC, CMP, troponin within normal limits. CT head negative for acute intra-cranial pathology but positive for chronic infarct in left basal ganglia. EKG sinus tachycardia with ventricular rate of 54 bpm. Hospitalist service was consulted for  further management. Patient at the current time is comfortably resting in the bed and continues to have right lower extremity weakness.  PAST MEDICAL HISTORY:   Past Medical History  Diagnosis Date  . CVA (cerebrovascular accident due to intracerebral hemorrhage) 2010  . cva 2014, 10/30    Right-sided facial droop, and right extremities weakness.  . Hypertension   . Hypothyroidism   . GERD (gastroesophageal reflux disease)     PAST SURGICAL HISTORY:   Past Surgical History  Procedure Laterality Date  . Eye surgery      SOCIAL HISTORY:   Social History  Substance Use Topics  . Smoking status: Never Smoker   . Smokeless tobacco: Never Used  . Alcohol Use: No    FAMILY HISTORY:   Family History  Problem Relation Age of Onset  . Cancer Mother   . Cancer - Other Father   . Melanoma Brother   . Colon cancer Brother     DRUG ALLERGIES:   Allergies  Allergen Reactions  . Sulfa Antibiotics Other (See Comments)    Reaction: Unknown    REVIEW OF SYSTEMS:   Review of Systems  Constitutional: Negative for fever, chills and malaise/fatigue.  HENT: Positive for hearing loss (Right ear). Negative for ear pain, nosebleeds, sore throat and tinnitus.   Eyes: Negative for blurred vision, double vision, pain, discharge and redness.  Respiratory: Negative for cough, hemoptysis, sputum production, shortness of breath and wheezing.   Cardiovascular: Negative for chest pain, palpitations, orthopnea and leg swelling.  Gastrointestinal: Negative for nausea, vomiting, abdominal pain, diarrhea, constipation, blood in  stool and melena.  Genitourinary: Negative for dysuria, urgency, frequency and hematuria.  Musculoskeletal: Negative for back pain, joint pain and neck pain.  Skin: Negative for itching and rash.  Neurological: Positive for dizziness (Ongoing spells of dizziness for the past 2 weeks), focal weakness and weakness. Negative for tingling, sensory change and seizures.        Right lower extremity +  Endo/Heme/Allergies: Does not bruise/bleed easily.  Psychiatric/Behavioral: Negative for depression. The patient is not nervous/anxious.     MEDICATIONS AT HOME:   Prior to Admission medications   Medication Sig Start Date End Date Taking? Authorizing Provider  atorvastatin (LIPITOR) 10 MG tablet Take 10 mg by mouth daily.   Yes Historical Provider, MD  fluticasone (FLONASE) 50 MCG/ACT nasal spray Place 2 sprays into the nose daily.   Yes Historical Provider, MD  isosorbide mononitrate (IMDUR) 30 MG 24 hr tablet Take 1 tablet by mouth daily. 09/06/14  Yes Historical Provider, MD  loratadine (CLARITIN) 10 MG tablet Take 10 mg by mouth daily.   Yes Historical Provider, MD  omeprazole (PRILOSEC) 20 MG capsule Take 20 mg by mouth 2 (two) times daily before a meal.    Yes Historical Provider, MD  aspirin 81 MG tablet Take 81 mg by mouth daily.    Historical Provider, MD  levothyroxine (SYNTHROID, LEVOTHROID) 100 MCG tablet Take 100 mcg by mouth daily before breakfast.    Historical Provider, MD  meclizine (ANTIVERT) 12.5 MG tablet Take 12.5 mg by mouth 3 (three) times daily as needed for dizziness.    Historical Provider, MD  ondansetron (ZOFRAN) 4 MG tablet Take 4 mg by mouth every 8 (eight) hours as needed for nausea or vomiting.    Historical Provider, MD      VITAL SIGNS:  Blood pressure 201/99, pulse 56, temperature 97.6 F (36.4 C), temperature source Oral, resp. rate 18, height 5\' 9"  (1.753 m), weight 87.091 kg (192 lb), SpO2 96 %.  PHYSICAL EXAMINATION:  Physical Exam  Constitutional: He is oriented to person, place, and time. He appears well-developed and well-nourished. No distress.  HENT:  Head: Normocephalic and atraumatic.  Right Ear: External ear normal.  Left Ear: External ear normal.  Nose: Nose normal.  Mouth/Throat: Oropharynx is clear and moist. No oropharyngeal exudate.  Eyes: EOM are normal. Pupils are equal, round, and reactive to light. No  scleral icterus.  Neck: Normal range of motion. Neck supple. No JVD present. No thyromegaly present.  Cardiovascular: Normal rate, regular rhythm, normal heart sounds and intact distal pulses.  Exam reveals no friction rub.   No murmur heard. Respiratory: Effort normal and breath sounds normal. No respiratory distress. He has no wheezes. He has no rales. He exhibits no tenderness.  GI: Soft. Bowel sounds are normal. He exhibits no distension and no mass. There is no tenderness. There is no rebound and no guarding.  Musculoskeletal: Normal range of motion. He exhibits no edema.  Lymphadenopathy:    He has no cervical adenopathy.  Neurological: He is alert and oriented to person, place, and time. He displays normal reflexes. No cranial nerve deficit.  Right lower extremity power 3-4/5  Skin: Skin is warm. No rash noted. No erythema.  Psychiatric: He has a normal mood and affect. His behavior is normal. Thought content normal.   LABORATORY PANEL:   CBC  Recent Labs Lab 05/18/15 2229  WBC 10.7*  HGB 15.8  HCT 48.9  PLT 218   ------------------------------------------------------------------------------------------------------------------  Chemistries   Recent Labs Lab  05/18/15 2229  NA 137  K 3.9  CL 100*  CO2 27  GLUCOSE 100*  BUN 17  CREATININE 0.86  CALCIUM 8.9  AST 28  ALT 24  ALKPHOS 87  BILITOT 0.8   ------------------------------------------------------------------------------------------------------------------  Cardiac Enzymes  Recent Labs Lab 05/18/15 2229  TROPONINI <0.03   ------------------------------------------------------------------------------------------------------------------  RADIOLOGY:  Ct Head Wo Contrast  05/19/2015   CLINICAL DATA:  Acute onset of generalized weakness. Difficulty hearing. Initial encounter.  EXAM: CT HEAD WITHOUT CONTRAST  TECHNIQUE: Contiguous axial images were obtained from the base of the skull through the vertex  without intravenous contrast.  COMPARISON:  CT of the head performed 07/16/2013  FINDINGS: There is no evidence of acute infarction, mass lesion, or intra- or extra-axial hemorrhage on CT.  Prominence of the ventricles and sulci reflects mild to moderate cortical volume loss. Cerebellar atrophy is noted. Chronic infarct is noted at the left basal ganglia, with associated prominence of the frontal horn of the left lateral ventricle. Scattered periventricular and subcortical white matter change likely reflects small vessel ischemic microangiopathy, most prominent at the high right frontal lobe.  The brainstem and fourth ventricle are within normal limits. The cerebral hemispheres demonstrate grossly normal gray-white differentiation. No mass effect or midline shift is seen.  There is no evidence of fracture; visualized osseous structures are unremarkable in appearance. The orbits are within normal limits. The paranasal sinuses and mastoid air cells are well-aerated. No significant soft tissue abnormalities are seen.  IMPRESSION: 1. No acute intracranial pathology seen on CT. 2. Mild to moderate cortical volume loss and scattered small vessel ischemic microangiopathy. 3. Chronic infarct at the left basal ganglia, with ex vacuo dilatation of the frontal horn of the left lateral ventricle.   Electronically Signed   By: Roanna Raider M.D.   On: 05/19/2015 00:59   Dg Chest Port 1 View  05/19/2015   CLINICAL DATA:  Weakness  EXAM: PORTABLE CHEST - 1 VIEW  COMPARISON:  07/16/2013  FINDINGS: A single AP portable view of the chest demonstrates no focal airspace consolidation or alveolar edema. The lungs are grossly clear. There is no large effusion or pneumothorax. Cardiac and mediastinal contours appear unremarkable.  IMPRESSION: No active disease.   Electronically Signed   By: Ellery Plunk M.D.   On: 05/19/2015 01:42    EKG:   Orders placed or performed during the hospital encounter of 05/18/15  . EKG 12-Lead   . EKG 12-Lead  . ED EKG  . ED EKG  Sinus bradycardia with ventricular rate of 54 bpm  IMPRESSION AND PLAN:   81 year old Caucasian male with history of hypertension, CVA 2, hyperlipidemia, hypothyroidism presents to the emergency room with the complaints of right lower extremity weakness with inability to get up from the recliner and stand. 1. Right lower extremity weakness with inability to get up from the recliner and stand. CVA, new onset. History of prior CVA 2. 2. History of CVA. 3. Hypertension, stable. 4. Ongoing dizzy spells for the past 2 weeks. Had Holter monitor recently. 5. Hyperlipidemia, stable on statin. 6. Hypothyroidism, stable on meds. Plan: Admit, neuro watch, nothing by mouth, continue aspirin, order echocardiogram, carotid Doppler and MRI brain. Neuro consultation requested for further advice. Procedure hypertension, follow BP monitoring.   All the records are reviewed and case discussed with ED provider. Management plans discussed with the patient, family and they are in agreement.  CODE STATUS: Full code  TOTAL TIME TAKING CARE OF THIS PATIENT: 50 minutes.  Jonnie Kind N M.D on 05/19/2015 at 2:06 AM  Between 7am to 6pm - Pager - [phone]  After 6pm go to [email or web address] - password [email or web address] [facility]  [place] [facility]  Office  [phone]  CC: Primary care physician; Oneal Grout, MD                                        Bethann Punches of [facility] clinic

## 2015-05-19 NOTE — Progress Notes (Signed)
Ryan Cantu is a 81 y.o. male   SUBJECTIVE:  Patient with recent symptoms of palpitations associated with some weakness presents with right lower extremity weakness. He notes no trouble with dysarthria or aphasia, no upper extremity symptoms, no chest discomfort.  ______________________________________________________________________  ROS: Review of systems is unremarkable for any active cardiac,respiratory, GI, GU, hematologic, neurologic or psychiatric systems, 10 systems reviewed.  Marland Kitchen aspirin EC  81 mg Oral Daily  . atorvastatin  10 mg Oral QHS  . clopidogrel  75 mg Oral Daily  . enoxaparin (LOVENOX) injection  40 mg Subcutaneous Daily  . fluticasone  1 spray Each Nare Daily  . isosorbide mononitrate  30 mg Oral Daily  . levothyroxine  100 mcg Oral QAC breakfast  . loratadine  10 mg Oral Daily  . pantoprazole  40 mg Oral QAC breakfast  . sodium chloride  3 mL Intravenous Q12H   acetaminophen **OR** acetaminophen, meclizine, ondansetron **OR** ondansetron (ZOFRAN) IV, ondansetron   Past Medical History  Diagnosis Date  . CVA (cerebrovascular accident due to intracerebral hemorrhage) 2010  . cva 2014, 10/30    Right-sided facial droop, and right extremities weakness.  . Hypertension   . Hypothyroidism   . GERD (gastroesophageal reflux disease)     Past Surgical History  Procedure Laterality Date  . Eye surgery      PHYSICAL EXAM:  BP 152/60 mmHg  Pulse 50  Temp(Src) 98.1 F (36.7 C) (Oral)  Resp 18  Ht  (1.753 m)  Wt 85.458 kg (188 lb 6.4 oz)  BMI 27.81 kg/m2  SpO2 94%  Wt Readings from Last 3 Encounters:  05/19/15 85.458 kg (188 lb 6.4 oz)           BP Readings from Last 3 Encounters:  05/19/15 152/60  07/30/13 148/74    Constitutional: NAD Neck: supple, no thyromegaly Respiratory: CTA, no rales or wheezes Cardiovascular: RRR, no murmur, no gallop Abdomen: soft, good BS, nontender Extremities: no edema Neuro: alert and oriented, moderate right  lower extremity weakness, normal mentation and speech  ASSESSMENT/PLAN:  Labs and imaging studies were reviewed  CVA-significant right lower extremity weakness, brain MRI, carotid Doppler, add Plavix to baby aspirin, follow telemetry closely for arrhythmia Hyperlipidemia-continue Lipitor Hypertension-no medication at this time Will get physical therapy involved

## 2015-05-19 NOTE — Plan of Care (Signed)
Problem: Discharge/Transitional Outcomes Goal: Other Discharge Outcomes/Goals Outcome: Progressing Plan of care progress: -NIH scale 3 -MRI negative for acute -CT scan negative -generalized weakness -episode of vomiting PRN zofran given per orders with relief -no complaints of pain, no distress or discomfort noted

## 2015-05-19 NOTE — Care Management (Signed)
Admitted to [facility] with the diagnosis of weakness right lower extremity. Lives alone. Daughter is Adrian Prince [phone]). Home Health thru [facility] 6 years ago. Doesn't  Remember name of agency. [facility] about 6 years ago and [facility] [employer] October 2 years ago. Seen Dr. Bethann Punches last Monday. Takes care of all activities of daily living himself, still drives. Daughter takes care of some basic instrumental activities herself. [employer] - on [employer] Still cooks some. Uses a rolling walker when he first gets up in the morning, but after he  gets his balance, sits the walker aside per daughter. Good appetite. Daughter will transport. Gwenette Greet RN MSN Care Management [phone]

## 2015-05-19 NOTE — Plan of Care (Signed)
Problem: Discharge/Transitional Outcomes Goal: Barriers To Progression Addressed/Resolved Individualization: Pt prefers to be called Mr. Steckman who lives at home alone.  Hx stroke, HTN, Gerd, Hypothyroid, & high cholesterol controlled by home medications.  High fall risk. Bed alarm on, hourly rounding. Pt understands how to use call system for assistance.

## 2015-05-19 NOTE — Progress Notes (Signed)
*  PRELIMINARY RESULTS* Echocardiogram 2D Echocardiogram has been performed.  Ryan Cantu Hege 05/19/2015, 8:37 AM

## 2015-05-19 NOTE — Progress Notes (Signed)
PT Cancellation Note  Patient Details Name: Ryan Cantu MRN: [id number] DOB: 1924-08-28   Cancelled Treatment:    Reason Eval/Treat Not Completed: Patient at procedure or test/unavailable (Consult received and chart reviewed.  Patient currently off unit for diagnostic testing.  Will re-attempt at later time as medically appropriate and available.)   [name] H. Manson Passey, PT, DPT, NCS 05/19/2015, 8:52 AM [phone]

## 2015-05-19 NOTE — Progress Notes (Signed)
PT Cancellation Note  Patient Details Name: Ryan Cantu MRN: [id number] DOB: [birth year]   Cancelled Treatment:    Reason Eval/Treat Not Completed: Other (comment) (Patient now returned from testing, but just now eating meal tray.  Will re-attempt in PM as appropriate and available.)   Valena Ivanov H. Manson Passey, PT, DPT, NCS 05/19/2015, 11:34 AM [phone]

## 2015-05-19 NOTE — Evaluation (Signed)
Physical Therapy Evaluation Patient Details Name: Ryan Cantu MRN: [id number] DOB: 01-07-1924 Today's Date: 05/19/2015   History of Present Illness  presented to ER secondary to R LE weakness (and inability to stand), dizziness x2 weeks and decreased hearing in L ear; admitted for acute hospitalization for TIA/CVA.  Initial head CT/MRI negative for acute pathology.  Clinical Impression  Upon evaluation, patient alert and oriented, follows all commands; rather HOH L > R (L ear hearing change in recent days).  Bilat UE/LE strength and ROM grossly WFL and symmetrical; no focal weakness, drift or sensory deficit appreciated.  Able to complete bed mobility mod indep; sit/stand, basic transfers and short-distance gait with RW, min assist.  Patient consistently reporting dizziness with horizontal head turns during gait trials (L > R), requiring min assist to prevent LOB/fall.  Unable to tolerate >50' this date due to fatigue, nausea (+ emesis upon return to room). Orthostatic negative; vestibular screen reveals signs/symptoms possibly consistent with vestibular neuritis/labrynthitis; may benefit from ENT consult to assess if MRI/EEG negative. Would benefit from skilled PT to address above deficits and promote optimal return to PLOF; recommend transition to STR upon discharge from acute hospitalization (may progress to HHPT; will continue to assess as symptoms resolve).     Follow Up Recommendations SNF (may improve to HHPT; will continue to assess as symptoms resolve)    Equipment Recommendations       Recommendations for Other Services       Precautions / Restrictions Precautions Precautions: Fall Restrictions Weight Bearing Restrictions: No      Mobility  Bed Mobility Overal bed mobility: Modified Independent                Transfers Overall transfer level: Needs assistance Equipment used: Rolling walker (2 wheeled) Transfers: Sit to/from Stand Sit to Stand: Min assist          General transfer comment: cuing for hand placement  Ambulation/Gait Ambulation/Gait assistance: Min assist Ambulation Distance (Feet): 50 Feet Assistive device: Rolling walker (2 wheeled)       General Gait Details: short, choppy steps with broad BOS; very guarded trunk and cervical posturing.  Episodes of dizziness noted with head turns in each direction (L > R) during gait trial requiring min assist to prevent LOB.  Stairs            Wheelchair Mobility    Modified Rankin (Stroke Patients Only)       Balance Overall balance assessment: Needs assistance Sitting-balance support: No upper extremity supported;Feet supported Sitting balance-Leahy Scale: Good     Standing balance support: Bilateral upper extremity supported Standing balance-Leahy Scale: Fair                               Pertinent Vitals/Pain Pain Assessment: No/denies pain    Home Living Family/patient expects to be discharged to:: Private residence Living Arrangements: Alone Available Help at Discharge: Family Type of Home: House Home Access: Stairs to enter Entrance Stairs-Rails: Right Entrance Stairs-Number of Steps: 3 Home Layout: Two level;Able to live on main level with bedroom/bathroom        Prior Function Level of Independence: Independent with assistive device(s)         Comments: Consistently uses RW each morning until dressed and ready for day, then ambulatory without assist device.  Denies fall history.     Hand Dominance   Dominant Hand: Right    Extremity/Trunk Assessment   Upper  Extremity Assessment: Overall WFL for tasks assessed (bilat UE strength grossly WFL and symmetrical; no focal weakness, drift or sensory deficit noted)           Lower Extremity Assessment: Overall WFL for tasks assessed (LE strength grossly WFL and symmetrical; no focal weakness or sensory deficit appreciated)         Communication   Communication: No difficulties   Cognition Arousal/Alertness: Awake/alert Behavior During Therapy: WFL for tasks assessed/performed Overall Cognitive Status: Within Functional Limits for tasks assessed                      General Comments      Exercises Other Exercises Other Exercises: Vestibular testing: no significant resting or gaze-evoked nystagmus; mild difficulty with L eye when returning to neutral (inconsistent).  No head-shaking nystagmus; saccades WFL; no skew/deviation.  Positive for hearing loss in L ear.  Positive reproduction of symptoms with horizontal head turns (L > R).  Signs/symptoms possibly consistent with vestibular neuritis/labrynthitis; may benefit from ENT consult to assess (10 min)      Assessment/Plan    PT Assessment Patient needs continued PT services  PT Diagnosis Difficulty walking;Generalized weakness   PT Problem List    PT Treatment Interventions DME instruction;Gait training;Stair training;Functional mobility training;Therapeutic activities;Therapeutic exercise;Balance training;Patient/family education   PT Goals (Current goals can be found in the Care Plan section) Acute Rehab PT Goals Patient Stated Goal: to get better PT Goal Formulation: With patient/family Time For Goal Achievement: 06/02/15 Potential to Achieve Goals: Good    Frequency Min 2X/week   Barriers to discharge Decreased caregiver support      Co-evaluation               End of Session Equipment Utilized During Treatment: Gait belt Activity Tolerance: Patient tolerated treatment well (episode of emesis end of session; RN informed/aware) Patient left: in chair;with call bell/phone within reach;with chair alarm set;with family/visitor present Nurse Communication: Mobility status         Time: 1610-9604 PT Time Calculation (min) (ACUTE ONLY): 39 min   Charges:   PT Evaluation $Initial PT Evaluation Tier I: 1 Procedure PT Treatments $Therapeutic Activity: 8-22 mins   PT G Codes:         Linnette Panella H. Manson Passey, PT, DPT, NCS 05/19/2015, 3:15 PM [phone]

## 2015-05-19 NOTE — Consult Note (Signed)
Reason for Consult: stroke Referring Physician: Dr. Paula Compton is an 81 y.o. male.  HPI: seen at request of Dr. Sabra Heck for stroke;  81 yo RHD M with hx of prior strokes presents to [facility] secondary to dizziness for 2 weeks then sudden loss of his hearing on the L.  He also reports that he has more R sided weakness than before even though he had some residual weakness from previous stroke prior.  He denies photophobia or headache or even loss of consciousness.  He denies any other focal changes.  Past Medical History  Diagnosis Date  . CVA (cerebrovascular accident due to intracerebral hemorrhage) 2010  . cva 2014, 10/30    Right-sided facial droop, and right extremities weakness.  . Hypertension   . Hypothyroidism   . GERD (gastroesophageal reflux disease)     Past Surgical History  Procedure Laterality Date  . Eye surgery      Family History  Problem Relation Age of Onset  . Cancer Mother   . Cancer - Other Father   . Melanoma Brother   . Colon cancer Brother     Social History:  reports that he has never smoked. He has never used smokeless tobacco. He reports that he does not drink alcohol or use illicit drugs.  Allergies:  Allergies  Allergen Reactions  . Sulfa Antibiotics Other (See Comments)    Reaction: Unknown    Medications: personally reviewed by me  Results for orders placed or performed during the hospital encounter of 05/18/15 (from the past 48 hour(s))  CBC with Differential     Status: Abnormal   Collection Time: 05/18/15 10:29 PM  Result Value Ref Range   WBC 10.7 (H) 3.8 - 10.6 K/uL   RBC 5.13 4.40 - 5.90 MIL/uL   Hemoglobin 15.8 13.0 - 18.0 g/dL   HCT 48.9 40.0 - 52.0 %   MCV 95.3 80.0 - 100.0 fL   MCH 30.8 26.0 - 34.0 pg   MCHC 32.4 32.0 - 36.0 g/dL   RDW 14.1 11.5 - 14.5 %   Platelets 218 150 - 440 K/uL   Neutrophils Relative % 70 %   Neutro Abs 7.5 (H) 1.4 - 6.5 K/uL   Lymphocytes Relative 20 %   Lymphs Abs 2.1 1.0 - 3.6 K/uL    Monocytes Relative 7 %   Monocytes Absolute 0.7 0.2 - 1.0 K/uL   Eosinophils Relative 2 %   Eosinophils Absolute 0.2 0 - 0.7 K/uL   Basophils Relative 1 %   Basophils Absolute 0.1 0 - 0.1 K/uL  Comprehensive metabolic panel     Status: Abnormal   Collection Time: 05/18/15 10:29 PM  Result Value Ref Range   Sodium 137 135 - 145 mmol/L   Potassium 3.9 3.5 - 5.1 mmol/L   Chloride 100 (L) 101 - 111 mmol/L   CO2 27 22 - 32 mmol/L   Glucose, Bld 100 (H) 65 - 99 mg/dL   BUN 17 6 - 20 mg/dL   Creatinine, Ser 0.86 0.61 - 1.24 mg/dL   Calcium 8.9 8.9 - 10.3 mg/dL   Total Protein 6.9 6.5 - 8.1 g/dL   Albumin 4.0 3.5 - 5.0 g/dL   AST 28 15 - 41 U/L   ALT 24 17 - 63 U/L   Alkaline Phosphatase 87 38 - 126 U/L   Total Bilirubin 0.8 0.3 - 1.2 mg/dL   GFR calc non Af Amer >60 >60 mL/min   GFR calc Af Amer >60 >60  mL/min    Comment: (NOTE) The eGFR has been calculated using the CKD EPI equation. This calculation has not been validated in all clinical situations. eGFR's persistently <60 mL/min signify possible Chronic Kidney Disease.    Anion gap 10 5 - 15  Troponin I     Status: None   Collection Time: 05/18/15 10:29 PM  Result Value Ref Range   Troponin I <0.03 <0.031 ng/mL    Comment:        NO INDICATION OF MYOCARDIAL INJURY.   Urinalysis complete, with microscopic ([facility] only)     Status: Abnormal   Collection Time: 05/18/15 11:54 PM  Result Value Ref Range   Color, Urine YELLOW (A) YELLOW   APPearance CLEAR (A) CLEAR   Glucose, UA NEGATIVE NEGATIVE mg/dL   Bilirubin Urine NEGATIVE NEGATIVE   Ketones, ur NEGATIVE NEGATIVE mg/dL   Specific Gravity, Urine 1.010 1.005 - 1.030   Hgb urine dipstick NEGATIVE NEGATIVE   pH 6.0 5.0 - 8.0   Protein, ur NEGATIVE NEGATIVE mg/dL   Nitrite NEGATIVE NEGATIVE   Leukocytes, UA NEGATIVE NEGATIVE   RBC / HPF 0-5 0 - 5 RBC/hpf   WBC, UA 0-5 0 - 5 WBC/hpf   Bacteria, UA NONE SEEN NONE SEEN   Squamous Epithelial / LPF NONE SEEN NONE SEEN    Mucous PRESENT   Sedimentation rate     Status: None   Collection Time: 05/19/15  4:31 PM  Result Value Ref Range   Sed Rate 2 0 - 20 mm/hr    Ct Head Wo Contrast  05/19/2015   CLINICAL DATA:  Acute onset of generalized weakness. Difficulty hearing. Initial encounter.  EXAM: CT HEAD WITHOUT CONTRAST  TECHNIQUE: Contiguous axial images were obtained from the base of the skull through the vertex without intravenous contrast.  COMPARISON:  CT of the head performed 07/16/2013  FINDINGS: There is no evidence of acute infarction, mass lesion, or intra- or extra-axial hemorrhage on CT.  Prominence of the ventricles and sulci reflects mild to moderate cortical volume loss. Cerebellar atrophy is noted. Chronic infarct is noted at the left basal ganglia, with associated prominence of the frontal horn of the left lateral ventricle. Scattered periventricular and subcortical white matter change likely reflects small vessel ischemic microangiopathy, most prominent at the high right frontal lobe.  The brainstem and fourth ventricle are within normal limits. The cerebral hemispheres demonstrate grossly normal gray-white differentiation. No mass effect or midline shift is seen.  There is no evidence of fracture; visualized osseous structures are unremarkable in appearance. The orbits are within normal limits. The paranasal sinuses and mastoid air cells are well-aerated. No significant soft tissue abnormalities are seen.  IMPRESSION: 1. No acute intracranial pathology seen on CT. 2. Mild to moderate cortical volume loss and scattered small vessel ischemic microangiopathy. 3. Chronic infarct at the left basal ganglia, with ex vacuo dilatation of the frontal horn of the left lateral ventricle.   Electronically Signed   By: Garald Balding M.D.   On: 05/19/2015 00:59   Mr Brain Wo Contrast  05/19/2015   CLINICAL DATA:  Right lower extremity weakness and dizziness for 2 weeks. Pressure in the head. Left ear hearing loss. Two  prior strokes.  EXAM: MRI HEAD WITHOUT CONTRAST  TECHNIQUE: Multiplanar, multiecho pulse sequences of the brain and surrounding structures were obtained without intravenous contrast.  COMPARISON:  Head CT 05/19/2015  FINDINGS: There is no evidence of acute infarct, mass, midline shift, or extra-axial fluid collection. A chronic  inferior left basal ganglia infarct is again seen with associated chronic blood products. There is associated ex vacuo dilatation of the left frontal horn. There is a chronic lacunar infarct in the left corona radiata. A small, chronic subcortical infarct is noted in the right frontal lobe.  Additional periventricular white matter T2 hyperintensities are nonspecific but compatible with mild chronic small vessel ischemic disease. There is also a small, chronic infarct in the upper pons just left of midline. There is moderate generalized cerebral atrophy.  Prior bilateral cataract extraction is noted. No significant inflammatory disease is seen in the paranasal sinuses or mastoid air cells. Major intracranial vascular flow voids are preserved.  IMPRESSION: 1. No acute intracranial abnormality. 2. Chronic infarcts as above, most notably in the left basal ganglia.   Electronically Signed   By: Logan Bores M.D.   On: 05/19/2015 10:30   US Carotid Bilateral  05/19/2015   CLINICAL DATA:  STROKE.  Syncope, visual disturbance.  EXAM: BILATERAL CAROTID DUPLEX ULTRASOUND  TECHNIQUE: Pearline Cables scale imaging, color Doppler and duplex ultrasound was performed of bilateral carotid and vertebral arteries in the neck.  COMPARISON:  None.  REVIEW OF SYSTEMS: Quantification of carotid stenosis is based on velocity parameters that correlate the residual internal carotid diameter with NASCET-based stenosis levels, using the diameter of the distal internal carotid lumen as the denominator for stenosis measurement.  The following velocity measurements were obtained:  PEAK SYSTOLIC/END DIASTOLIC  RIGHT  ICA:                      45/13cm/sec  CCA:                     785/88FO/YDX  SYSTOLIC ICA/CCA RATIO:  0.3  DIASTOLIC ICA/CCA RATIO: 1.3  ECA:                     77cm/sec  LEFT  ICA:                     70/22cm/sec  CCA:                     41/2IN/OMV  SYSTOLIC ICA/CCA RATIO:  1.7  DIASTOLIC ICA/CCA RATIO: 3.5  ECA:                     60cm/sec  FINDINGS: RIGHT CAROTID ARTERY: Very tortuous common carotid artery. Eccentric plaque in the distal common carotid artery, bulb, and proximal ICA, 0 smooth with minimal if any calcification. No high-grade stenosis. Normal waveforms and color Doppler signal.  RIGHT VERTEBRAL ARTERY:  Normal flow direction and waveform.  LEFT CAROTID ARTERY: Mild plaque through the mid and distal common carotid artery extending through the bulb into the proximal ICA. No high-grade stenosis. Normal waveforms and color Doppler signal.  LEFT VERTEBRAL ARTERY: Normal flow direction and waveform.  IMPRESSION: 1. Bilateral carotid bifurcation and proximal ICA plaque, resulting in less than 50% diameter stenosis. The exam does not exclude plaque ulceration or embolization. Continued surveillance recommended.   Electronically Signed   By: Lucrezia Europe M.D.   On: 05/19/2015 10:46   Dg Chest Port 1 View  05/19/2015   CLINICAL DATA:  Weakness  EXAM: PORTABLE CHEST - 1 VIEW  COMPARISON:  07/16/2013  FINDINGS: A single AP portable view of the chest demonstrates no focal airspace consolidation or alveolar edema. The lungs are grossly clear. There is no large effusion or  pneumothorax. Cardiac and mediastinal contours appear unremarkable.  IMPRESSION: No active disease.   Electronically Signed   By: Andreas Newport M.D.   On: 05/19/2015 01:42    Review of Systems  Constitutional: Negative.   HENT: Positive for hearing loss and tinnitus. Negative for congestion, ear discharge, ear pain, nosebleeds and sore throat.   Eyes: Negative.   Respiratory: Negative.  Negative for stridor.   Cardiovascular: Negative.    Gastrointestinal: Positive for nausea. Negative for heartburn, vomiting, abdominal pain, diarrhea, constipation, blood in stool and melena.  Genitourinary: Negative.   Musculoskeletal: Negative.   Skin: Negative.   Neurological: Positive for dizziness, focal weakness and headaches. Negative for tingling, tremors, sensory change, speech change, seizures and loss of consciousness.  Psychiatric/Behavioral: Negative.    Blood pressure 126/85, pulse 59, temperature 97.9 F (36.6 C), temperature source Oral, resp. rate 18, height 5' 9"  (1.753 m), weight 85.458 kg (188 lb 6.4 oz), SpO2 93 %. Physical Exam  Nursing note and vitals reviewed. Constitutional: He is oriented to person, place, and time. He appears well-developed and well-nourished.  HENT:  Head: Normocephalic and atraumatic.  Right Ear: External ear normal.  Left Ear: External ear normal.  Nose: Nose normal.  Mouth/Throat: Oropharynx is clear and moist.  Eyes: Conjunctivae and EOM are normal. Pupils are equal, round, and reactive to light.  Neck: Normal range of motion. Neck supple.  Cardiovascular: Normal rate, regular rhythm, normal heart sounds and intact distal pulses.   No murmur heard. Respiratory: Effort normal and breath sounds normal. No respiratory distress.  GI: Soft. Bowel sounds are normal. He exhibits no distension.  Musculoskeletal: Normal range of motion. He exhibits no edema.  Neurological: He is alert and oriented to person, place, and time. He has normal reflexes. He displays normal reflexes. No cranial nerve deficit. Coordination normal.  Skin: Skin is warm and dry. He is not diaphoretic.  Psychiatric: He has a normal mood and affect.   MRI of brain personally reviewed by me and shows L temporal encephalomalacia with no acute infarcts  Assessment/Plan: 1.  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  Can not r/o other processes such as acoustic  swannoma or menigitis or even epileptic phenomena.  This is not a stroke 2.  L temporal encephalomalacia-  Stable -  MRI of brain w/ contrast -  EEG in am -  Start meclizine 12.67m TID -  Continue ASA 872mdaily -  Check ESR and CRP -  May need LP -  Will follow  Dejon Jungman 05/19/2015, 9:45 PM

## 2015-05-20 ENCOUNTER — Inpatient Hospital Stay: Payer: Medicare Other

## 2015-05-20 ENCOUNTER — Ambulatory Visit: Payer: Medicare Other

## 2015-05-20 LAB — HIGH SENSITIVITY CRP: CRP HIGH SENSITIVITY: 5.98 mg/L — AB (ref 0.00–3.00)

## 2015-05-20 MED ORDER — MECLIZINE HCL 12.5 MG PO TABS
12.5000 mg | ORAL_TABLET | Freq: Two times a day (BID) | ORAL | Status: DC
  Administered 2015-05-20 – 2015-05-22 (×5): 12.5 mg via ORAL
  Filled 2015-05-20 (×5): qty 1

## 2015-05-20 MED ORDER — LACOSAMIDE 50 MG PO TABS
50.0000 mg | ORAL_TABLET | Freq: Two times a day (BID) | ORAL | Status: DC
  Administered 2015-05-20 – 2015-05-22 (×6): 50 mg via ORAL
  Filled 2015-05-20 (×7): qty 1

## 2015-05-20 MED ORDER — GADOBENATE DIMEGLUMINE 529 MG/ML IV SOLN
20.0000 mL | Freq: Once | INTRAVENOUS | Status: AC | PRN
  Administered 2015-05-20: 12:00:00 17 mL via INTRAVENOUS

## 2015-05-20 MED ORDER — FLUTICASONE PROPIONATE 50 MCG/ACT NA SUSP
2.0000 | Freq: Every day | NASAL | Status: DC
  Administered 2015-05-20 – 2015-05-22 (×3): 2 via NASAL
  Filled 2015-05-20: qty 16

## 2015-05-20 NOTE — Plan of Care (Signed)
Problem: Discharge/Transitional Outcomes Goal: Other Discharge Outcomes/Goals Outcome: Progressing 1. No c/o pain or discomfort.   2. Hemodynamically:             -VSS, afebrile, sinus brady on telemetry               -Neuro & VS Q4H, NIH 1 3. Tolerating soft foods without problem  4. +1 assist out of bed. Generalized weakness w/ right leg lower extremity worst. PT consulted. Will continue to assess.

## 2015-05-20 NOTE — Plan of Care (Signed)
Problem: Discharge/Transitional Outcomes Goal: Other Discharge Outcomes/Goals Outcome: Progressing VSS. NIH score 1. A&Ox4. No neurological changes noted during the shift. No c/o dizziness. Denies pain.

## 2015-05-20 NOTE — Progress Notes (Signed)
Patient ID: Ryan Cantu, male   DOB: August 01, 1924, 81 y.o.   MRN: [id number] Ryan Cantu is a 82 y.o. male   SUBJECTIVE:  Patient feeling somewhat better. Right leg weakness doing better as well, MRI/carotid negative ______________________________________________________________________  ROS: Review of systems is unremarkable for any active cardiac,respiratory, GI, GU, hematologic, neurologic or psychiatric systems, 10 systems reviewed.  Marland Kitchen amLODipine  5 mg Oral BID  . aspirin EC  81 mg Oral Daily  . atorvastatin  10 mg Oral QHS  . fluticasone  2 spray Each Nare Daily  . isosorbide mononitrate  30 mg Oral Daily  . levothyroxine  100 mcg Oral QAC breakfast  . loratadine  10 mg Oral Daily  . meclizine  12.5 mg Oral TID  . pantoprazole  40 mg Oral QAC breakfast  . sodium chloride  3 mL Intravenous Q12H   acetaminophen **OR** acetaminophen, meclizine, ondansetron **OR** ondansetron (ZOFRAN) IV, ondansetron   Past Medical History  Diagnosis Date  . CVA (cerebrovascular accident due to intracerebral hemorrhage) 2010  . cva 2014, 10/30    Right-sided facial droop, and right extremities weakness.  . Hypertension   . Hypothyroidism   . GERD (gastroesophageal reflux disease)     Past Surgical History  Procedure Laterality Date  . Eye surgery      PHYSICAL EXAM:  BP 149/88 mmHg  Pulse 60  Temp(Src) 97.8 F (36.6 C) (Oral)  Resp 18  Ht  (1.753 m)  Wt 85.775 kg (189 lb 1.6 oz)  BMI 27.91 kg/m2  SpO2 94%  Wt Readings from Last 3 Encounters:  05/20/15 85.775 kg (189 lb 1.6 oz)           BP Readings from Last 3 Encounters:  05/20/15 149/88  07/30/13 148/74    Constitutional: NAD Neck: supple, no thyromegaly Respiratory: CTA, no rales or wheezes Cardiovascular: RRR, no murmur, no gallop Abdomen: soft, good BS, nontender Extremities: no edema Neuro: alert and oriented, normal speech, nonfocal, right leg strength better   ASSESSMENT/PLAN:  Labs and imaging studies  were reviewed  Right leg weakness-unclear etiology, no back pain, MRI negative, no CVA. Physical therapy Weak spells-possible related to bradycardia, somewhat improved with amlodipine twice a day  Hyperlipidemia-continue Lipitor Hypertension-twice a day amlodipine Home over the weekend versus skilled nursing, depending on physical therapy

## 2015-05-20 NOTE — Progress Notes (Signed)
PT Cancellation Note  Patient Details Name: Saim Almanza MRN: [id number] DOB: 07-21-1924   Cancelled Treatment:    Reason Eval/Treat Not Completed:  (Treatment attempted; patient currently leaving unit for MRI.  Will re-attempt at later time/date as medically appropriate and available.)  Beckam Abdulaziz H. Manson Passey, PT, DPT, NCS 05/20/2015, 11:16 AM [phone]

## 2015-05-20 NOTE — Progress Notes (Signed)
NEUROLOGY NOTE  S: dizziness is better today, still cant hear that well in the L ear  ROS unobtainable secondary to mental status  O: 98.0 136/79 65 20 Nl weight, no distress Normocephalic, mild ear fluid behind the TMs B, oropharynx clear Supple, nl JVD CTA B, good movement RRR, no murmur No C/C/E  A+Ox3, nl speech and language PERRLA, EOMI without nystagmus, face symmetric 5/5 B, nl tone  MRI w/ contrast personally reviewed by me and is normal  A/P: 1. 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  Abnormal EEG-  A few sharp waves seen in the L temporal region as suspected which could worsen dizziness -  Needs ENT consult -  Full audiogram as outpatient -  Decreased scheduled meclizine to 12.5mg  BID with PRN as necessary -  Start Vimpat  BID for abnormal EEG -  Continue therapy -  No obvious need for LP now -  Will sign off, please call with further questions -  Needs to f/u with [facility] in 1-2 months

## 2015-05-21 MED ORDER — DOCUSATE SODIUM 100 MG PO CAPS
100.0000 mg | ORAL_CAPSULE | Freq: Two times a day (BID) | ORAL | Status: DC
  Administered 2015-05-21 – 2015-05-22 (×4): 100 mg via ORAL
  Filled 2015-05-21 (×5): qty 1

## 2015-05-21 MED ORDER — BISACODYL 10 MG RE SUPP
10.0000 mg | Freq: Every day | RECTAL | Status: DC | PRN
  Administered 2015-05-21: 10 mg via RECTAL
  Filled 2015-05-21: qty 1

## 2015-05-21 NOTE — Plan of Care (Signed)
Problem: Discharge/Transitional Outcomes Goal: Other Discharge Outcomes/Goals Outcome: Progressing 1. Pt reports has recent loss of hearing in his left ear and HOH in his right ear. Reports he has macular degeneration. 2. Education plan completed with stroke work up negative at this time. MD discontinued neuro checks and NIH. 3. Labs and VSS. 4. PT consult requested with PT aware MD desires pt be seen today. Up to chair with 1+ x 2 hours. Family reported he had "that feeling in his head" again- pt sitting in chair going to sleep. Pt denies changes, concerns or co's at this time. No change in condition. 5. Tolerating diet well with no swallowing issues. ST consult completed with no further needs. 6. Discharge planner consult ordered. Pt desires to go home, but lives alone. PT recommends rehab before going home. Family concerned about the "spells" he is having. 7. Reports he is able to obtain needed meds upon discharge. 8. ENT consult done with no changes.

## 2015-05-21 NOTE — Progress Notes (Signed)
Physical Therapy Treatment Patient Details Name: Ryan Cantu MRN: [id number] DOB: 01/02/1924 Today's Date: 05/21/2015    History of Present Illness presented to ER secondary to R LE weakness (and inability to stand), dizziness x2 weeks and decreased hearing in L ear; admitted for acute hospitalization for TIA/CVA.  Initial head CT/MRI negative for acute pathology.    PT Comments    Pt eager to work with PT and shows good effort, but is still not at all close to his baseline with ambulation and has multiple bouts of R knee buckling.  He is able to do some ambulation with limited and then no UE use, but is very guarded and hesitant with this.  He also shows good effort with standing balance acts, but with some challenges he is unable to maintain balance without excessive UE use and leans to the R. (he also looses his balance backwards on initially getting to standing on a few occasions.   Follow Up Recommendations  SNF (Pt not yet safe enough to be able to go home alone)     Equipment Recommendations       Recommendations for Other Services       Precautions / Restrictions Precautions Precautions: Fall Restrictions Weight Bearing Restrictions: No    Mobility  Bed Mobility Overal bed mobility:  (NT, pt in recliner)                Transfers Overall transfer level: Needs assistance Equipment used:  (able to rise w/o AD, but did lose balance X2 onto seat) Transfers: Sit to/from Stand Sit to Stand: Min assist;Supervision (multiple times sit to stand, 2 losses of balance backwards)            Ambulation/Gait Ambulation/Gait assistance: Min assist;Min guard Ambulation Distance (Feet): 150 Feet Assistive device: Rolling walker (2 wheeled) (~25 ft with hand rail and ~25 ft w/o UEs )       General Gait Details: Pt walking much slower and more guarded than his baseline.  He has no true losses of balance, but has multiple times when R knee buckles and he needs increased UE  use to maintain balance.    Stairs            Wheelchair Mobility    Modified Rankin (Stroke Patients Only)       Balance               Standing balance comment: performed 2-3 second SLS on each leg with HHA and heavy cuing.  Pt does fairly well on the L, but R knee has some buckling and he needs excessive UE use and leans heavily to the R with most R WBing attempts. Pt did well with b/l heel raises, though again does need signficant HHA support.                     Cognition Arousal/Alertness: Awake/alert Behavior During Therapy: WFL for tasks assessed/performed Overall Cognitive Status: Within Functional Limits for tasks assessed                      Exercises General Exercises - Lower Extremity Long Arc Quad: AROM;Strengthening;10 reps Hip ABduction/ADduction: Strengthening;10 reps Hip Flexion/Marching: AROM;10 reps    General Comments        Pertinent Vitals/Pain Pain Assessment: No/denies pain    Home Living                      Prior Function  PT Goals (current goals can now be found in the care plan section) Progress towards PT goals: Progressing toward goals    Frequency  Min 2X/week    PT Plan Current plan remains appropriate    Co-evaluation             End of Session Equipment Utilized During Treatment: Gait belt Activity Tolerance: Patient tolerated treatment well Patient left: with chair alarm set;with family/visitor present     Time: 1646-1730 PT Time Calculation (min) (ACUTE ONLY): 44 min  Charges:  $Gait Training: 8-22 mins $Therapeutic Exercise: 8-22 mins $Neuromuscular Re-education: 8-22 mins                    G Codes:     Ryan Cantu, PT, DPT [phone]  Ryan Cantu 05/21/2015, 6:16 PM

## 2015-05-21 NOTE — Consult Note (Signed)
Aliou, Mealey [id number] 15-Jul-1924 Danella Penton, MD  Reason for Consult:Vertigo lasting 2 weeks with sudden onset left sided hearing loss.   HPI: The patient is an 81 year old white male who is had a history of previous CVA and right-sided leg weakness from 2 years ago. His symptoms have mostly resolved so he was ambulatory. 2 weeks ago he had acute vertigo that started and was affecting his ability to walk. The last couple days C had increasing weakness of his right leg with spasms at time and sudden onset of left-sided hearing loss. His hearing has not improved yet, but his vertigo seems to be settling down. He has not had any upper respiratory infections or signs of nasal congestion. No ear pain or drainage from his ears. His hearing was previously fairly normal for his age. Allergies:  Allergies  Allergen Reactions  . Sulfa Antibiotics Other (See Comments)    Reaction: Unknown    ROS: Review of systems normal other than 12 systems except per HPI.  PMH:  Past Medical History  Diagnosis Date  . CVA (cerebrovascular accident due to intracerebral hemorrhage) 2010  . cva 2014, 10/30    Right-sided facial droop, and right extremities weakness.  . Hypertension   . Hypothyroidism   . GERD (gastroesophageal reflux disease)     FH:  Family History  Problem Relation Age of Onset  . Cancer Mother   . Cancer - Other Father   . Melanoma Brother   . Colon cancer Brother     SH:  Social History   Social History  . Marital Status: Widowed    Spouse Name: widowed  . Number of Children: N/A  . Years of Education: N/A   Occupational History  . Archivist     Retired   Social History Main Topics  . Smoking status: Never Smoker   . Smokeless tobacco: Never Used  . Alcohol Use: No  . Drug Use: No  . Sexual Activity: Not Currently   Other Topics Concern  . Not on file   Social History Narrative    PSH:  Past Surgical History  Procedure Laterality Date  . Eye surgery       Physical  Exam: His facial nerve is grossly intact and symmetric. Decreased hearing left side. No nystagmus currently. EAC/TMs normal BL. Oral cavity, lips, gums, ororpharynx normal with no masses or lesions. Skin warm and dry. Nasal cavity without polyps or purulence. External nose and ears without masses or lesions. EOMI, PERRLA. Neck supple with no masses or lesions. No lymphadenopathy palpated. Thyroid normal with no masses.   A/P: Acute labyrinthitis, likely left side, unknown cause. Acute left sided sensorineural hearing loss, likely from same cause. Will plan to do audiogram and set up ENG for once pt is discharged from the hospital, and follow up with him in the office to go over results. Because the vertigo is settling down, use meclizine only if very dizzy. Try to remain active now, but discussed importance of being safe by using a walker to get around. He can be seen sooner if any new ENT symptoms arise.    Aleatha Taite H 05/21/2015 12:31 PM

## 2015-05-21 NOTE — Plan of Care (Signed)
Problem: Discharge/Transitional Outcomes Goal: Other Discharge Outcomes/Goals Plan of care progress to goal: Hemodynamically: VSS Mobility: one assist Diet: tolerating diet C/O  Loss of hearing in left ear with tenderness

## 2015-05-21 NOTE — Progress Notes (Signed)
Ryan Cantu is a 81 y.o. male  Weakness of right lower extremity   SUBJECTIVE:  Pt still dizzy with some left-sided hearing loss. Nuero consult noted. Has not gotten OOB recently. No BM x 3 days. Denies CP or SOB.  ______________________________________________________________________  ROS: Review of systems is unremarkable for any active cardiac,respiratory, GI, GU, hematologic, neurologic or psychiatric systems, 10 systems reviewed.  @  Past Medical History  Diagnosis Date  . CVA (cerebrovascular accident due to intracerebral hemorrhage) 2010  . cva 2014, 10/30    Right-sided facial droop, and right extremities weakness.  . Hypertension   . Hypothyroidism   . GERD (gastroesophageal reflux disease)     Past Surgical History  Procedure Laterality Date  . Eye surgery      PHYSICAL EXAM:  BP 152/79 mmHg  Pulse 66  Temp(Src) 97.6 F (36.4 C) (Oral)  Resp 20  Ht  (1.753 m)  Wt 85.367 kg (188 lb 3.2 oz)  BMI 27.78 kg/m2  SpO2 94%  Wt Readings from Last 3 Encounters:  05/21/15 85.367 kg (188 lb 3.2 oz)            Constitutional: NAD Neck: supple, no thyromegaly Respiratory: CTA, no rales or wheezes Cardiovascular: RRR, no murmur, no gallop Abdomen: soft, good BS, nontender Extremities: no edema Neuro: alert and oriented, no focal motor or sensory deficits  ASSESSMENT/PLAN:  Labs and imaging studies were reviewed  ENT consult today per Neuro. PT and CM to evaluate. Begin bowel regimen. Hope to D/C tomorrow.

## 2015-05-21 NOTE — Progress Notes (Signed)
Speech Therapy Note: reviewed chart notes noting ENT consult and f/u. NSG and chart notes indicate no CVA but probable Meniere's Dis. NSG and family/pt have reported no dysphagia; A/O x3 w/ normal speech and language per Neurologist's note. ST services will sign off at this time w/ NSG to reconsult if nec. NSG agreed.

## 2015-05-21 NOTE — Care Management Note (Addendum)
Case Management Note  Patient Details  Name: Ryan Cantu MRN: [id number] Date of Birth: May 25, 1924  Subjective/Objective:    Discussed discharge planning with Ryan Cantu and daughter Ryan Cantu Ph: [phone]. Per Ryan Cantu, both she and Ryan Cantu have already discussed it and neither of them feel that Ryan Cantu is safe to return home alone at this time. Daughter Ryan Cantu is requesting temporary SNF/rehab placement. [facility]-PT also recommended SNF on 05/19/15. This Clinical research associate updated CSW Ryan Cantu and made a social work consult. PCP=Dr Mahima Pandey. Pharmacy = [facility] in [place]. Ryan Cantu lives alone and uses a rolling walker PRN. No home oxygen. A friend/neighbor Ryan Cantu provides transportation but will be out of the country for the next few weeks. Ryan Cantu was at [employer] [employer] 6 years ago, and was at [facility] [employer] 2 years ago. Daughter Ryan Cantu prefers [facility]	[place] as a first Pharmacist, community as a second Surveyor, mining of Rehab facilities.                Action/Plan:   Expected Discharge Date:                  Expected Discharge Plan:     In-House Referral:     Discharge planning Services     Post Acute Care Choice:    Choice offered to:     DME Arranged:    DME Agency:     HH Arranged:    HH Agency:     Status of Service:     Medicare Important Message Given:    Date Medicare IM Given:    Medicare IM give by:    Date Additional Medicare IM Given:    Additional Medicare Important Message give by:     If discussed at Long Length of Stay Meetings, dates discussed:    Additional Comments:  Ryan Cantu A, RN 05/21/2015, 4:09 PM

## 2015-05-22 LAB — BASIC METABOLIC PANEL
ANION GAP: 6 (ref 5–15)
BUN: 17 mg/dL (ref 6–20)
CALCIUM: 8.9 mg/dL (ref 8.9–10.3)
CO2: 30 mmol/L (ref 22–32)
CREATININE: 0.86 mg/dL (ref 0.61–1.24)
Chloride: 104 mmol/L (ref 101–111)
GFR calc Af Amer: >60 mL/min (ref >60)
GLUCOSE: 97 mg/dL (ref 65–99)
Potassium: 4.2 mmol/L (ref 3.5–5.1)
Sodium: 140 mmol/L (ref 135–145)

## 2015-05-22 LAB — CBC WITH DIFFERENTIAL/PLATELET
BASOS ABS: 0 10*3/uL (ref 0–0.1)
BASOS PCT: 0 %
EOS PCT: 4 %
Eosinophils Absolute: 0.4 10*3/uL (ref 0–0.7)
HCT: 48.9 % (ref 40.0–52.0)
Hemoglobin: 16.3 g/dL (ref 13.0–18.0)
LYMPHS PCT: 20 %
Lymphs Abs: 2.1 10*3/uL (ref 1.0–3.6)
MCH: 31.8 pg (ref 26.0–34.0)
MCHC: 33.4 g/dL (ref 32.0–36.0)
MCV: 95.3 fL (ref 80.0–100.0)
MONO ABS: 0.8 10*3/uL (ref 0.2–1.0)
Monocytes Relative: 8 %
Neutro Abs: 7.3 10*3/uL — ABNORMAL HIGH (ref 1.4–6.5)
Neutrophils Relative %: 68 %
PLATELETS: 210 10*3/uL (ref 150–440)
RBC: 5.13 MIL/uL (ref 4.40–5.90)
RDW: 13.9 % (ref 11.5–14.5)
WBC: 10.7 10*3/uL — ABNORMAL HIGH (ref 3.8–10.6)

## 2015-05-22 NOTE — Progress Notes (Signed)
Ryan Cantu is a 81 y.o. male  Weakness of right lower extremity   SUBJECTIVE:  Pt still unsteady on his feet. Not safe to go home per PT. Still dizzy. ENT consult noted. Labs stable. VSS.  ______________________________________________________________________  ROS: Review of systems is unremarkable for any active cardiac,respiratory, GI, GU, hematologic, neurologic or psychiatric systems, 10 systems reviewed.  @  Past Medical History  Diagnosis Date  . CVA (cerebrovascular accident due to intracerebral hemorrhage) 2010  . cva 2014, 10/30    Right-sided facial droop, and right extremities weakness.  . Hypertension   . Hypothyroidism   . GERD (gastroesophageal reflux disease)     Past Surgical History  Procedure Laterality Date  . Eye surgery      PHYSICAL EXAM:  BP 108/60 mmHg  Pulse 65  Temp(Src) 98.2 F (36.8 C) (Oral)  Resp 18  Ht  (1.753 m)  Wt 84.567 kg (186 lb 7 oz)  BMI 27.52 kg/m2  SpO2 93%  Wt Readings from Last 3 Encounters:  05/22/15 84.567 kg (186 lb 7 oz)            Constitutional: NAD Neck: supple, no thyromegaly Respiratory: CTA, no rales or wheezes Cardiovascular: RRR, no murmur, no gallop Abdomen: soft, good BS, nontender Extremities: no edema Neuro: alert and oriented, no focal motor or sensory deficits  ASSESSMENT/PLAN:  Labs and imaging studies were reviewed  No change in care. Continue PT. Awaiting placement.

## 2015-05-22 NOTE — Clinical Social Work Note (Signed)
Clinical Social Work Assessment  Patient Details  Name: Ryan Cantu MRN: [id number] Date of Birth: [birth year]  Date of referral:  05/22/15               Reason for consult:  Facility Placement                Permission sought to share information with:  Chartered certified accountant granted to share information::  Yes, Verbal Permission Granted  Name::      [place]::   [place]   Relationship::     Contact Information:     Housing/Transportation Living arrangements for the past 2 months:  [place] of Information:  Patient Patient Interpreter Needed:  None Criminal Activity/Legal Involvement Pertinent to Current Situation/Hospitalization:  No - Comment as needed Significant Relationships:  Adult Children Lives with:  Self Do you feel safe going back to the place where you live?  Yes Need for family participation in patient care:  Yes (Comment)  Care giving concerns: Patient lives alone in [place].    Facilities manager / plan: Holiday representative ([place]) met with patient to discuss D/C plan. Patient was sitting up in the chair and alert and oriented. CSW introduced self and explained role of CSW department. Patient reported that he lives alone in [place] and his daughter Lenis Noon [phone] is his primary support. Patient is agreeable to SNF search and prefers [facility]. CSW encouraged patient to pick a second SNF choice in case [facility] did not have availability. SNF list was provided. Per MD patient will D/C Monday.   FL2 complete and on chart.   Employment status:  Retired Nurse, adult PT Recommendations:  [place] / Referral to community resources:  [place]  Patient/Family's Response to care: Patient is agreeable to [facility] in [place].   Patient/Family's Understanding of and Emotional Response to Diagnosis,  Current Treatment, and Prognosis: Patient was pleasant throughout assessment and thanked CSW for visit.   Emotional Assessment Appearance:  Appears stated age Attitude/Demeanor/Rapport:    Affect (typically observed):  Accepting, Adaptable, Pleasant Orientation:  Oriented to Self, Oriented to Place, Oriented to  Time, Oriented to Situation Alcohol / Substance use:  Not Applicable Psych involvement (Current and /or in the community):  No (Comment)  Discharge Needs  Concerns to be addressed:  Discharge Planning Concerns Readmission within the last 30 days:  No Current discharge risk:  Dependent with Mobility Barriers to Discharge:  Continued Medical Work up   Loralyn Freshwater, LCSW 05/22/2015, 2:31 PM

## 2015-05-22 NOTE — Plan of Care (Signed)
Problem: Discharge/Transitional Outcomes Goal: Other Discharge Outcomes/Goals Outcome: Progressing VSS. Denies pain. No c/o dizziness/n/v. Up to the chair today with one assist. Pt hopes to be discharge to [facility]. Awaiting placement.

## 2015-05-22 NOTE — Clinical Social Work Placement (Signed)
   CLINICAL SOCIAL WORK PLACEMENT  NOTE  Date:  05/22/2015  Patient Details  Name: Ryan Cantu MRN: [id number] Date of Birth: [birth year]  Clinical Social Work is seeking post-discharge placement for this patient at the Skilled  Nursing Facility level of care (*CSW will initial, date and re-position this form in  chart as items are completed):  Yes   Patient/family provided with [facility] Clinical Social Work Department's list of facilities offering this level of care within the geographic area requested by the patient (or if unable, by the patient's family).  Yes   Patient/family informed of their freedom to choose among providers that offer the needed level of care, that participate in Medicare, Medicaid or managed care program needed by the patient, have an available bed and are willing to accept the patient.  Yes   Patient/family informed of [facility]'s ownership interest in [facility] and [facility], as well as of the fact that they are under no obligation to receive care at these facilities.  PASRR submitted to EDS on       PASRR number received on       Existing PASRR number confirmed on 05/22/15     FL2 transmitted to all facilities in geographic area requested by pt/family on 05/22/15     FL2 transmitted to all facilities within larger geographic area on       Patient informed that his/her managed care company has contracts with or will negotiate with certain facilities, including the following:            Patient/family informed of bed offers received.  Patient chooses bed at       Physician recommends and patient chooses bed at      Patient to be transferred to   on  .  Patient to be transferred to facility by       Patient family notified on   of transfer.  Name of family member notified:        PHYSICIAN Please sign FL2     Additional Comment:    _______________________________________________ Haig Prophet, LCSW 05/22/2015, 2:30 PM

## 2015-05-22 NOTE — Plan of Care (Signed)
Problem: Discharge/Transitional Outcomes Goal: Other Discharge Outcomes/Goals Outcome: Progressing Plan of care progress to goals: Barriers- pt lives at home alone and has verbalized that he will need assistance upon d/c, case management/SW consult entered. Educational plan complete- continue as needed.  Hemodynamically stable- VSS, continue to monitor labs. Telemetry NSR.  Indepenedent mobiltiy- pt up with 1 assist.  Diet- tolerating diet. No c/o nausea or pain this shift. Family and pt agree on d/c plan in progress.  Other discharge goals- pt is unable to care for himself at present, plan is pt will d/c to rehab.

## 2015-05-23 ENCOUNTER — Encounter
Admission: RE | Admit: 2015-05-23 | Discharge: 2015-05-23 | Disposition: A | Discharge status: Home / Self Care | Payer: Medicare Other | Source: Ambulatory Visit | Attending: Internal Medicine | Admitting: Internal Medicine

## 2015-05-23 MED ORDER — AMLODIPINE BESYLATE 5 MG PO TABS: 5.0000 mg | ORAL_TABLET | Freq: Two times a day (BID) | ORAL | Status: DC

## 2015-05-23 MED ORDER — DOCUSATE SODIUM 100 MG PO CAPS: 100.0000 mg | ORAL_CAPSULE | Freq: Two times a day (BID) | ORAL | Status: DC

## 2015-05-23 MED ORDER — BISACODYL 10 MG RE SUPP: 10.0000 mg | Freq: Every day | RECTAL | Status: DC | PRN

## 2015-05-23 MED ORDER — ONDANSETRON HCL 4 MG PO TABS: 4.0000 mg | ORAL_TABLET | Freq: Four times a day (QID) | ORAL | Status: DC | PRN

## 2015-05-23 MED ORDER — LACOSAMIDE 50 MG PO TABS: 50.0000 mg | ORAL_TABLET | Freq: Two times a day (BID) | ORAL | Status: DC

## 2015-05-23 NOTE — Discharge Instructions (Signed)
Transfer to SNF.

## 2015-05-23 NOTE — Clinical Social Work Note (Signed)
Patient had bed offers this morning from several facilities and did not have bed offers from facility of choice. CSW texted admissions coordinator at 8am at [facility] to see if they were able to offer or taking admissions today. CSW did not hear back and patient chose bed at [facility]. Everything was arranged for patient to go to [facility] and then CSW received text from [facility] admission's coordinator at 9:40am stating that they could take patient. CSW informed patient and daughter and they wanted to switch to going to [facility] today. Discharge summary sent and patient's daughter to transport. [facility] notified that patient would not be coming. York Spaniel MSW,LCSW [phone]

## 2015-05-23 NOTE — Clinical Social Work Placement (Signed)
   CLINICAL SOCIAL WORK PLACEMENT  NOTE  Date:  05/23/2015  Patient Details  Name: Ryan Cantu MRN: [id number] Date of Birth: 23-Jul-1924  Clinical Social Work is seeking post-discharge placement for this patient at the Skilled  Nursing Facility level of care (*CSW will initial, date and re-position this form in  chart as items are completed):  Yes   Patient/family provided with [facility] Clinical Social Work Department's list of facilities offering this level of care within the geographic area requested by the patient (or if unable, by the patient's family).  Yes   Patient/family informed of their freedom to choose among providers that offer the needed level of care, that participate in Medicare, Medicaid or managed care program needed by the patient, have an available bed and are willing to accept the patient.  Yes   Patient/family informed of [facility]'s ownership interest in [facility] and [facility], as well as of the fact that they are under no obligation to receive care at these facilities.  PASRR submitted to EDS on       PASRR number received on       Existing PASRR number confirmed on 05/22/15     FL2 transmitted to all facilities in geographic area requested by pt/family on 05/22/15     FL2 transmitted to all facilities within larger geographic area on       Patient informed that his/her managed care company has contracts with or will negotiate with certain facilities, including the following:        Yes   Patient/family informed of bed offers received.  Patient chooses bed at  [facility])     Physician recommends and patient chooses bed at  [facility])    Patient to be transferred to  [facility]) on  .  Patient to be transferred to facility by  (daughter)     Patient family notified on 05/23/15 of transfer.  Name of family member notified:   Erie Noe)     PHYSICIAN Please sign FL2     Additional Comment:     _______________________________________________ York Spaniel, LCSW 05/23/2015, 10:23 AM

## 2015-05-23 NOTE — Plan of Care (Signed)
Problem: Discharge/Transitional Outcomes Goal: Other Discharge Outcomes/Goals Outcome: Progressing Plan of care progress to goals: Barriers- pt lives at home alone and has verbalized that he will need assistance upon d/c, SW consult done today and pt verbalized that he would like to go to [facility]	[place].  Educational plan complete- continue as needed.   Hemodynamically stable- VSS, continue to monitor labs. Telemetry NSR. No c/o dizziness this shift.  Indepenedent mobiltiy- pt up with 1 assist.   Diet- tolerating diet. No c/o nausea or pain this shift. Family and pt agree on d/c plan in progress.   Other discharge goals- pt is unable to care for himself at present, plan is pt will d/c to rehab.

## 2015-05-23 NOTE — Care Management Important Message (Signed)
Important Message  Patient Details  Name: Ryan Cantu MRN: [id number] Date of Birth: 1924-02-24   Medicare Important Message Given:  Yes-third notification given    Gwenette Greet, RN 05/23/2015, 8:22 AM

## 2015-05-23 NOTE — Discharge Summary (Signed)
Ryan Cantu, is a 81 y.o. male  DOB 06/14/1924  MRN [id number].  Admission date:  05/18/2015  Admitting Physician  Crissie Figures, MD  Discharge Date:  05/23/2015   Primary MD  Oneal Grout, MD  Recommendations for primary care physician for things to follow:   Ataxia, dizziness   Admission Diagnosis  Weakness [R53.1] Near syncope [R55] Cerebral infarction due to unspecified mechanism [I63.9]   Discharge Diagnosis  CVA ruled out, vertigo, ataxia  Principal Problem:   Weakness of right lower extremity Active Problems:   CVA (cerebral vascular accident)   Hypothyroidism   Essential hypertension   History of CVA (cerebrovascular accident)      Past Medical History  Diagnosis Date  . CVA (cerebrovascular accident due to intracerebral hemorrhage) 2010  . cva 2014, 10/30    Right-sided facial droop, and right extremities weakness.  . Hypertension   . Hypothyroidism   . GERD (gastroesophageal reflux disease)     Past Surgical History  Procedure Laterality Date  . Eye surgery         History of present illness and  Hospital Course:     Kindly see H&P for history of present illness and admission details, please review complete Labs, Consult reports and Test reports for all details in brief  HPI  from the history and physical done on the day of admission    Hospital Course    Pt admitted with possible CVA with weakness and dizziness. Seen by neurology. CVA ruled out. ENT consult performed. Pt started on Antivert. Minimal improvement. PT recommended SNF. Pt in agreement.   Discharge Condition: stable   Follow UP  Follow-up Information    Follow up with Danella Penton., MD In 1 week.   Specialty:  Internal Medicine   Contact information:   [address] [place] [address] [phone]         Discharge Instructions  and  Discharge Medications   See  below     Medication List    TAKE these medications        amLODipine 5 MG tablet  Commonly known as:  NORVASC  Take 1 tablet (5 mg total) by mouth 2 (two) times daily.     aspirin 81 MG tablet  Take 81 mg by mouth daily.     atorvastatin 10 MG tablet  Commonly known as:  LIPITOR  Take 10 mg by mouth daily.     bisacodyl 10 MG suppository  Commonly known as:  DULCOLAX  Place 1 suppository (10 mg total) rectally daily as needed for moderate constipation.     docusate sodium 100 MG capsule  Commonly known as:  COLACE  Take 1 capsule (100 mg total) by mouth 2 (two) times daily.     fluticasone 50 MCG/ACT nasal spray  Commonly known as:  FLONASE  Place 2 sprays into the nose daily.     isosorbide mononitrate 30 MG 24 hr tablet  Commonly known as:  IMDUR  Take 1 tablet by mouth  daily.     lacosamide 50 MG Tabs tablet  Commonly known as:  VIMPAT  Take 1 tablet (50 mg total) by mouth 2 (two) times daily.     levothyroxine 100 MCG tablet  Commonly known as:  SYNTHROID, LEVOTHROID  Take 100 mcg by mouth daily before breakfast.     loratadine 10 MG tablet  Commonly known as:  CLARITIN  Take 10 mg by mouth daily.     meclizine 12.5 MG tablet  Commonly known as:  ANTIVERT  Take 12.5 mg by mouth 3 (three) times daily as needed for dizziness.     omeprazole 20 MG capsule  Commonly known as:  PRILOSEC  Take 20 mg by mouth 2 (two) times daily before a meal.     ondansetron 4 MG tablet  Commonly known as:  ZOFRAN  Take 1 tablet (4 mg total) by mouth every 6 (six) hours as needed for nausea.          Diet and Activity recommendation: See Discharge Instructions above   Consults obtained - Neurology, ENT   Major procedures and Radiology Reports - PLEASE review detailed and final reports for all details, in brief -   See below   Ct Head Wo Contrast  05/19/2015   CLINICAL DATA:  Acute onset of generalized weakness. Difficulty hearing. Initial encounter.  EXAM: CT  HEAD WITHOUT CONTRAST  TECHNIQUE: Contiguous axial images were obtained from the base of the skull through the vertex without intravenous contrast.  COMPARISON:  CT of the head performed 07/16/2013  FINDINGS: There is no evidence of acute infarction, mass lesion, or intra- or extra-axial hemorrhage on CT.  Prominence of the ventricles and sulci reflects mild to moderate cortical volume loss. Cerebellar atrophy is noted. Chronic infarct is noted at the left basal ganglia, with associated prominence of the frontal horn of the left lateral ventricle. Scattered periventricular and subcortical white matter change likely reflects small vessel ischemic microangiopathy, most prominent at the high right frontal lobe.  The brainstem and fourth ventricle are within normal limits. The cerebral hemispheres demonstrate grossly normal gray-white differentiation. No mass effect or midline shift is seen.  There is no evidence of fracture; visualized osseous structures are unremarkable in appearance. The orbits are within normal limits. The paranasal sinuses and mastoid air cells are well-aerated. No significant soft tissue abnormalities are seen.  IMPRESSION: 1. No acute intracranial pathology seen on CT. 2. Mild to moderate cortical volume loss and scattered small vessel ischemic microangiopathy. 3. Chronic infarct at the left basal ganglia, with ex vacuo dilatation of the frontal horn of the left lateral ventricle.   Electronically Signed   By: Roanna Raider M.D.   On: 05/19/2015 00:59   Mr Brain Wo Contrast  05/19/2015   CLINICAL DATA:  Right lower extremity weakness and dizziness for 2 weeks. Pressure in the head. Left ear hearing loss. Two prior strokes.  EXAM: MRI HEAD WITHOUT CONTRAST  TECHNIQUE: Multiplanar, multiecho pulse sequences of the brain and surrounding structures were obtained without intravenous contrast.  COMPARISON:  Head CT 05/19/2015  FINDINGS: There is no evidence of acute infarct, mass, midline shift, or  extra-axial fluid collection. A chronic inferior left basal ganglia infarct is again seen with associated chronic blood products. There is associated ex vacuo dilatation of the left frontal horn. There is a chronic lacunar infarct in the left corona radiata. A small, chronic subcortical infarct is noted in the right frontal lobe.  Additional periventricular white matter T2 hyperintensities  are nonspecific but compatible with mild chronic small vessel ischemic disease. There is also a small, chronic infarct in the upper pons just left of midline. There is moderate generalized cerebral atrophy.  Prior bilateral cataract extraction is noted. No significant inflammatory disease is seen in the paranasal sinuses or mastoid air cells. Major intracranial vascular flow voids are preserved.  IMPRESSION: 1. No acute intracranial abnormality. 2. Chronic infarcts as above, most notably in the left basal ganglia.   Electronically Signed   By: Sebastian Ache M.D.   On: 05/19/2015 10:30   Mr Laqueta Jean Contrast  05/20/2015   CLINICAL DATA:  Sudden onset of slurred speech with confusion. Dizziness. Evaluate for acoustic schwannoma, meningitis, or epileptic focus  EXAM: MRI HEAD WITH CONTRAST  TECHNIQUE: Multiplanar, multiecho pulse sequences of the brain and surrounding structures were obtained with intravenous contrast.  COMPARISON:  None contrast study from yesterday  CONTRAST:  17mL MULTIHANCE GADOBENATE DIMEGLUMINE 529 MG/ML IV SOLN  FINDINGS: Calvarium and upper cervical spine: No focal marrow signal abnormality.  Orbits: Negative  Sinuses and Mastoids: Clear. Mastoid and middle ears are clear.  Brain: No abnormal intracranial enhancement. Specifically, no evidence of acoustic schwannoma or meningitis.  Re- demonstrated extensive gliosis and encephalomalacia in the left basal ganglia and intervening white matter tracts, with hemosiderin staining. There is related Wallerian degeneration in the left cortical spinal tracts. Remote  ventriculostomy from right frontal approach, presumably needed for the left basal ganglia hemorrhagic insult. No hydrocephalus or major vessel occlusion.  IMPRESSION: No post infusion findings to explain dizziness.   Electronically Signed   By: Marnee Spring M.D.   On: 05/20/2015 12:31   US Carotid Bilateral  05/19/2015   CLINICAL DATA:  STROKE.  Syncope, visual disturbance.  EXAM: BILATERAL CAROTID DUPLEX ULTRASOUND  TECHNIQUE: Wallace Cullens scale imaging, color Doppler and duplex ultrasound was performed of bilateral carotid and vertebral arteries in the neck.  COMPARISON:  None.  REVIEW OF SYSTEMS: Quantification of carotid stenosis is based on velocity parameters that correlate the residual internal carotid diameter with NASCET-based stenosis levels, using the diameter of the distal internal carotid lumen as the denominator for stenosis measurement.  The following velocity measurements were obtained:  PEAK SYSTOLIC/END DIASTOLIC  RIGHT  ICA:                     45/13cm/sec  CCA:                     144/10cm/sec  SYSTOLIC ICA/CCA RATIO:  0.3  DIASTOLIC ICA/CCA RATIO: 1.3  ECA:                     77cm/sec  LEFT  ICA:                     70/22cm/sec  CCA:                     42/6cm/sec  SYSTOLIC ICA/CCA RATIO:  1.7  DIASTOLIC ICA/CCA RATIO: 3.5  ECA:                     60cm/sec  FINDINGS: RIGHT CAROTID ARTERY: Very tortuous common carotid artery. Eccentric plaque in the distal common carotid artery, bulb, and proximal ICA, 0 smooth with minimal if any calcification. No high-grade stenosis. Normal waveforms and color Doppler signal.  RIGHT VERTEBRAL ARTERY:  Normal flow direction and waveform.  LEFT CAROTID ARTERY: Mild plaque  through the mid and distal common carotid artery extending through the bulb into the proximal ICA. No high-grade stenosis. Normal waveforms and color Doppler signal.  LEFT VERTEBRAL ARTERY: Normal flow direction and waveform.  IMPRESSION: 1. Bilateral carotid bifurcation and proximal ICA plaque,  resulting in less than 50% diameter stenosis. The exam does not exclude plaque ulceration or embolization. Continued surveillance recommended.   Electronically Signed   By: Corlis Leak M.D.   On: 05/19/2015 10:46   Dg Chest Port 1 View  05/19/2015   CLINICAL DATA:  Weakness  EXAM: PORTABLE CHEST - 1 VIEW  COMPARISON:  07/16/2013  FINDINGS: A single AP portable view of the chest demonstrates no focal airspace consolidation or alveolar edema. The lungs are grossly clear. There is no large effusion or pneumothorax. Cardiac and mediastinal contours appear unremarkable.  IMPRESSION: No active disease.   Electronically Signed   By: Ellery Plunk M.D.   On: 05/19/2015 01:42    Micro Results   See below  No results found for this or any previous visit (from the past 240 hour(s)).     Today   Subjective:   Ryan Cantu today has no headache,no chest abdominal pain,no new weakness tingling or numbness, feels better. Agrees with SNF transfer today   Objective:   Blood pressure 144/70, pulse 68, temperature 97.5 F (36.4 C), temperature source Oral, resp. rate 18, height 5\' 9"  (1.753 m), weight 84.505 kg (186 lb 4.8 oz), SpO2 96 %.   Intake/Output Summary (Last 24 hours) at 05/23/15 0806 Last data filed at 05/23/15 0332  Gross per 24 hour  Intake    480 ml  Output   1200 ml  Net   -720 ml    Exam Awake Alert, Oriented x 3, No new F.N deficits, Normal affect Wright.AT,PERRAL Supple Neck,No JVD, No cervical lymphadenopathy appriciated.  Symmetrical Chest wall movement, Good air movement bilaterally, CTAB RRR,No Gallops,Rubs or new Murmurs, No Parasternal Heave +ve B.Sounds, Abd Soft, Non tender, No organomegaly appriciated, No rebound -guarding or rigidity. No Cyanosis, Clubbing or edema, No new Rash or bruise  Data Review   CBC w Diff: Lab Results  Component Value Date   WBC 10.7* 05/22/2015   WBC 10.5 07/16/2013   HGB 16.3 05/22/2015   HGB 16.7 07/16/2013   HCT 48.9 05/22/2015    HCT 50.0 07/16/2013   PLT 210 05/22/2015   PLT 206 07/16/2013   LYMPHOPCT 20 05/22/2015   MONOPCT 8 05/22/2015   EOSPCT 4 05/22/2015   BASOPCT 0 05/22/2015    CMP: Lab Results  Component Value Date   NA 140 05/22/2015   NA 138 07/16/2013   K 4.2 05/22/2015   K 3.9 07/16/2013   CL 104 05/22/2015   CL 106 07/16/2013   CO2 30 05/22/2015   CO2 27 07/16/2013   BUN 17 05/22/2015   BUN 9 07/16/2013   CREATININE 0.86 05/22/2015   CREATININE 0.95 07/16/2013   PROT 6.9 05/18/2015   PROT 7.3 07/16/2013   ALBUMIN 4.0 05/18/2015   ALBUMIN 3.7 07/16/2013   BILITOT 0.8 05/18/2015   BILITOT 0.8 07/16/2013   ALKPHOS 87 05/18/2015   ALKPHOS 104 07/16/2013   AST 28 05/18/2015   AST 31 07/16/2013   ALT 24 05/18/2015   ALT 24 07/16/2013  .   Total Time in preparing paper work, data evaluation and todays exam - 45 minutes  Allanah Mcfarland D M.D on 05/23/2015 at 8:06 AM

## 2015-11-07 ENCOUNTER — Encounter: Payer: Self-pay | Admitting: *Deleted

## 2015-11-07 ENCOUNTER — Emergency Department
Admission: EM | Admit: 2015-11-07 | Discharge: 2015-11-07 | Disposition: A | Discharge status: Home / Self Care | Payer: Medicare Other | Attending: Emergency Medicine | Admitting: Emergency Medicine

## 2015-11-07 ENCOUNTER — Emergency Department: Payer: Medicare Other

## 2015-11-07 DIAGNOSIS — I10 Essential (primary) hypertension: Secondary | ICD-10-CM | POA: Insufficient documentation

## 2015-11-07 DIAGNOSIS — Z7951 Long term (current) use of inhaled steroids: Secondary | ICD-10-CM | POA: Insufficient documentation

## 2015-11-07 DIAGNOSIS — Z7982 Long term (current) use of aspirin: Secondary | ICD-10-CM | POA: Diagnosis not present

## 2015-11-07 DIAGNOSIS — R109 Unspecified abdominal pain: Secondary | ICD-10-CM | POA: Diagnosis present

## 2015-11-07 DIAGNOSIS — Z79899 Other long term (current) drug therapy: Secondary | ICD-10-CM | POA: Insufficient documentation

## 2015-11-07 DIAGNOSIS — M545 Low back pain: Secondary | ICD-10-CM | POA: Insufficient documentation

## 2015-11-07 DIAGNOSIS — M7918 Myalgia, other site: Secondary | ICD-10-CM

## 2015-11-07 LAB — COMPREHENSIVE METABOLIC PANEL
ALBUMIN: 3.9 g/dL (ref 3.5–5.0)
ALK PHOS: 83 U/L (ref 38–126)
ALT: 21 U/L (ref 17–63)
AST: 31 U/L (ref 15–41)
Anion gap: 7 (ref 5–15)
BUN: 12 mg/dL (ref 6–20)
CHLORIDE: 105 mmol/L (ref 101–111)
CO2: 27 mmol/L (ref 22–32)
CREATININE: 0.78 mg/dL (ref 0.61–1.24)
Calcium: 9 mg/dL (ref 8.9–10.3)
GFR calc non Af Amer: >60 mL/min (ref >60)
GLUCOSE: 127 mg/dL — AB (ref 65–99)
Potassium: 3.9 mmol/L (ref 3.5–5.1)
SODIUM: 139 mmol/L (ref 135–145)
Total Bilirubin: 0.9 mg/dL (ref 0.3–1.2)
Total Protein: 7.1 g/dL (ref 6.5–8.1)

## 2015-11-07 LAB — URINALYSIS COMPLETE WITH MICROSCOPIC (ARMC ONLY)
BILIRUBIN URINE: NEGATIVE
Bacteria, UA: NONE SEEN
Glucose, UA: NEGATIVE mg/dL
Hgb urine dipstick: NEGATIVE
Ketones, ur: NEGATIVE mg/dL
Leukocytes, UA: NEGATIVE
Nitrite: NEGATIVE
PH: 6 (ref 5.0–8.0)
PROTEIN: NEGATIVE mg/dL
RBC / HPF: NONE SEEN RBC/hpf (ref 0–5)
SQUAMOUS EPITHELIAL / LPF: NONE SEEN
Specific Gravity, Urine: 1.012 (ref 1.005–1.030)

## 2015-11-07 LAB — CBC
HCT: 49.8 % (ref 40.0–52.0)
Hemoglobin: 16.4 g/dL (ref 13.0–18.0)
MCH: 30.8 pg (ref 26.0–34.0)
MCHC: 33 g/dL (ref 32.0–36.0)
MCV: 93.3 fL (ref 80.0–100.0)
PLATELETS: 202 10*3/uL (ref 150–440)
RBC: 5.33 MIL/uL (ref 4.40–5.90)
RDW: 13.6 % (ref 11.5–14.5)
WBC: 7.7 10*3/uL (ref 3.8–10.6)

## 2015-11-07 LAB — LIPASE, BLOOD: LIPASE: 28 U/L (ref 11–51)

## 2015-11-07 LAB — TROPONIN I: Troponin I: 0.03 ng/mL (ref <0.031)

## 2015-11-07 NOTE — ED Notes (Signed)
Patient states pain has improved since being in ER. Patient states this am had "searing" pain to right flank. Denies any h/o kidney stones. Patient states movement seemed to make pain worse this am. Patient currently resting comfortably on stretcher awaiting MD eval.

## 2015-11-07 NOTE — Discharge Instructions (Signed)
You were evaluated for intermittent right sided back pain, and no certain cause was found, however your exam and evaluation are reassuring today in the emergency room. I suspect muscle spasm/musculoskeletal pain.  Return to the emergency department for any new or worsening pain, any fever, abdominal pain, urinary symptoms, weakness or numbness, or any other symptoms concerning to you.   Musculoskeletal Pain Musculoskeletal pain is muscle and boney aches and pains. These pains can occur in any part of the body. Your caregiver may treat you without knowing the cause of the pain. They may treat you if blood or urine tests, X-rays, and other tests were normal.  CAUSES There is often not a definite cause or reason for these pains. These pains may be caused by a type of germ (virus). The discomfort may also come from overuse. Overuse includes working out too hard when your body is not fit. Boney aches also come from weather changes. Bone is sensitive to atmospheric pressure changes. HOME CARE INSTRUCTIONS   Ask when your test results will be ready. Make sure you get your test results.  Only take over-the-counter or prescription medicines for pain, discomfort, or fever as directed by your caregiver. If you were given medications for your condition, do not drive, operate machinery or power tools, or sign legal documents for 24 hours. Do not drink alcohol. Do not take sleeping pills or other medications that may interfere with treatment.  Continue all activities unless the activities cause more pain. When the pain lessens, slowly resume normal activities. Gradually increase the intensity and duration of the activities or exercise.  During periods of severe pain, bed rest may be helpful. Lay or sit in any position that is comfortable.  Putting ice on the injured area.  Put ice in a bag.  Place a towel between your skin and the bag.  Leave the ice on for 15 to 20 minutes, 3 to 4 times a day.  Follow  up with your caregiver for continued problems and no reason can be found for the pain. If the pain becomes worse or does not go away, it may be necessary to repeat tests or do additional testing. Your caregiver may need to look further for a possible cause. SEEK IMMEDIATE MEDICAL CARE IF:  You have pain that is getting worse and is not relieved by medications.  You develop chest pain that is associated with shortness or breath, sweating, feeling sick to your stomach (nauseous), or throw up (vomit).  Your pain becomes localized to the abdomen.  You develop any new symptoms that seem different or that concern you. MAKE SURE YOU:   Understand these instructions.  Will watch your condition.  Will get help right away if you are not doing well or get worse.   This information is not intended to replace advice given to you by your health care provider. Make sure you discuss any questions you have with your health care provider.   Document Released: 09/03/2005 Document Revised: 11/26/2011 Document Reviewed: 05/08/2013 Elsevier Interactive Patient Education [employer]

## 2015-11-07 NOTE — ED Notes (Signed)
Pt complains of right flank pain radiating to abdomen starting this morning after breakfast, pt denies any other symptoms

## 2015-11-10 NOTE — ED Provider Notes (Signed)
[facility] Emergency Department Provider Note   ____________________________________________  Time seen:  I have reviewed the triage vital signs and the triage nursing note.  HISTORY  Chief Complaint Abdominal Pain   Historian Patient, significant other  HPI Ryan Cantu is a 82 y.o. male who states that for couple of days she's been having intermittent right flank pain. First was relatively mild and worse with movement and now at times is burning sharp and severe. It is intermittent. This morning he had an episode that lasted about a half hour which was more severe. It is currently gone. No abdominal pain. No spine pain. No urinary symptoms. No fever. No change in bowels. No chest pain. No known trauma or overuse injury.    Past Medical History  Diagnosis Date  . CVA (cerebrovascular accident due to intracerebral hemorrhage) (HCC) 2010  . cva 2014, 10/30    Right-sided facial droop, and right extremities weakness.  . Hypertension   . Hypothyroidism   . GERD (gastroesophageal reflux disease)     Patient Active Problem List   Diagnosis Date Noted  . Weakness of right lower extremity 05/19/2015  . History of CVA (cerebrovascular accident) 05/19/2015  . Dizziness 07/30/2013  . GERD (gastroesophageal reflux disease) 07/30/2013  . CVA (cerebral vascular accident) (HCC) 07/23/2013  . Hypothyroidism 07/23/2013  . Essential hypertension 07/23/2013  . Late effects of CVA (cerebrovascular accident) 07/23/2013    Past Surgical History  Procedure Laterality Date  . Eye surgery      Current Outpatient Rx  Name  Route  Sig  Dispense  Refill  . amLODipine (NORVASC) 5 MG tablet   Oral   Take 1 tablet (5 mg total) by mouth 2 (two) times daily.   30 tablet   5   . aspirin 81 MG tablet   Oral   Take 81 mg by mouth daily.         Marland Kitchen atorvastatin (LIPITOR) 10 MG tablet   Oral   Take 10 mg by mouth daily.         . bisacodyl (DULCOLAX) 10 MG  suppository   Rectal   Place 1 suppository (10 mg total) rectally daily as needed for moderate constipation.   12 suppository   0   . docusate sodium (COLACE) 100 MG capsule   Oral   Take 1 capsule (100 mg total) by mouth 2 (two) times daily.   10 capsule   0   . fluticasone (FLONASE) 50 MCG/ACT nasal spray   Nasal   Place 2 sprays into the nose daily.         . isosorbide mononitrate (IMDUR) 30 MG 24 hr tablet   Oral   Take 30 mg by mouth daily.          Marland Kitchen lacosamide (VIMPAT) 50 MG TABS tablet   Oral   Take 1 tablet (50 mg total) by mouth 2 (two) times daily.   60 tablet   1   . levothyroxine (SYNTHROID, LEVOTHROID) 100 MCG tablet   Oral   Take 100 mcg by mouth daily before breakfast.         . loratadine (CLARITIN) 10 MG tablet   Oral   Take 10 mg by mouth daily.         . meclizine (ANTIVERT) 12.5 MG tablet   Oral   Take 12.5 mg by mouth 3 (three) times daily as needed for dizziness.         Marland Kitchen omeprazole (PRILOSEC)  20 MG capsule   Oral   Take 20 mg by mouth 2 (two) times daily before a meal.          . ondansetron (ZOFRAN) 4 MG tablet   Oral   Take 1 tablet (4 mg total) by mouth every 6 (six) hours as needed for nausea.   20 tablet   0     Allergies Sulfa antibiotics  Family History  Problem Relation Age of Onset  . Cancer Mother   . Cancer - Other Father   . Melanoma Brother   . Colon cancer Brother     Social History Social History  Substance Use Topics  . Smoking status: Never Smoker   . Smokeless tobacco: Never Used  . Alcohol Use: No    Review of Systems  Constitutional: Negative for fever. Eyes: Negative for visual changes. ENT: Negative for sore throat. Cardiovascular: Negative for chest pain. Respiratory: Negative for shortness of breath. Gastrointestinal: Negative for abdominal pain, vomiting and diarrhea. Genitourinary: Negative for dysuria. Musculoskeletal: Negative for extremity pain. Skin: Negative for  rash. Neurological: Negative for headache. 10 point Review of Systems otherwise negative ____________________________________________   PHYSICAL EXAM:  VITAL SIGNS: ED Triage Vitals  Enc Vitals Group     BP 11/07/15 0932 172/102 mmHg     Pulse Rate 11/07/15 0932 74     Resp 11/07/15 0932 20     Temp 11/07/15 0932 97.9 F (36.6 C)     Temp Source 11/07/15 0932 Oral     SpO2 11/07/15 0932 94 %     Weight 11/07/15 0932 199 lb (90.266 kg)     Height 11/07/15 0932 5\' 9"  (1.753 m)     Head Cir --      Peak Flow --      Pain Score 11/07/15 0940 7     Pain Loc --      Pain Edu? --      Excl. in GC? --      Constitutional: Alert and oriented. Well appearing and in no distress. HEENT   Head: Normocephalic and atraumatic.      Eyes: Conjunctivae are normal. PERRL. Normal extraocular movements.      Ears:         Nose: No congestion/rhinnorhea.   Mouth/Throat: Mucous membranes are moist.   Neck: No stridor. Cardiovascular/Chest: Normal rate, regular rhythm.  No murmurs, rubs, or gallops. Respiratory: Normal respiratory effort without tachypnea nor retractions. Breath sounds are clear and equal bilaterally. No wheezes/rales/rhonchi. Gastrointestinal: Soft. No distention, no guarding, no rebound. Nontender.   Genitourinary/rectal:Deferred Musculoskeletal: Mild low thoracic back pain, not particularly point tender. Nontender with normal range of motion in all extremities. No joint effusions.  No lower extremity tenderness.  No edema. Neurologic:  Normal speech and language. No gross or focal neurologic deficits are appreciated. Skin:  Skin is warm, dry and intact. No rash noted. Psychiatric: Mood and affect are normal. Speech and behavior are normal. Patient exhibits appropriate insight and judgment.  ____________________________________________   EKG I, Governor Rooks, MD, the attending physician have personally viewed and interpreted all  ECGs.  None ____________________________________________  LABS (pertinent positives/negatives)  Lipase 26 and comprehensive metabolic panel without significant abnormalities CBC within normal limits Urinalysis negative Troponin and lipase negative Urinalysis negative  ____________________________________________  RADIOLOGY All Xrays were viewed by me. Imaging interpreted by Radiologist.  Thoracic spine 2 views: Acute findings. Mild degenerative spondylosis and dextroscoliosis.  Lumbar spine complete:  IMPRESSION: 1. No acute or subacute osseous  abnormality. 2. Degenerative grade 1 retrolisthesis of L4 relative to L5 approximating 8 mm. 3. Severe degenerative disc disease and spondylosis at L3-4, L4-5 and L5-S1 and moderate degenerative disc disease and spondylosis at L2-3. 4. Diffuse facet degenerative changes. __________________________________________  PROCEDURES  Procedure(s) performed: None  Critical Care performed: None  ____________________________________________   ED COURSE / ASSESSMENT AND PLAN  Pertinent labs & imaging results that were available during my care of the patient were reviewed by me and considered in my medical decision making (see chart for details).   He's had intermittent right flank pain and now is essentially asymptomatic. He shows his back which is just paraspinous to the low thoracic area. He denies urinary symptoms. His urinalysis negative. He denies any trauma. He does have a bit of scoliosis and so we did discuss obtaining x-rays to make sure there is no spontaneous compression fractures.  No abdominal pain and no abdominal symptoms, and I discussed this with the family with his intermittent and now resolved right flank discomfort, reassuring laboratory studies, and no abdominal complaints or findings, I don't suggest abdominal imaging at this point in time.  I suspect musculoskeletal pain, and recommend patient follow-up with his  primary care physician.  CONSULTATIONS:   None   Patient / Family / Caregiver informed of clinical course, medical decision-making process, and agree with plan.   I discussed return precautions, follow-up instructions, and discharged instructions with patient and/or family.   ___________________________________________   FINAL CLINICAL IMPRESSION(S) / ED DIAGNOSES   Final diagnoses:  Musculoskeletal pain              Note: This dictation was prepared with Dragon dictation. Any transcriptional errors that result from this process are unintentional   Governor Rooks, MD 11/10/15 [phone]

## 2016-09-07 ENCOUNTER — Emergency Department
Admission: EM | Admit: 2016-09-07 | Discharge: 2016-09-07 | Disposition: A | Discharge status: Home / Self Care | Payer: Medicare Other | Attending: Student in an Organized Health Care Education/Training Program | Admitting: Student in an Organized Health Care Education/Training Program

## 2016-09-07 DIAGNOSIS — E039 Hypothyroidism, unspecified: Secondary | ICD-10-CM | POA: Diagnosis not present

## 2016-09-07 DIAGNOSIS — Z79899 Other long term (current) drug therapy: Secondary | ICD-10-CM | POA: Diagnosis not present

## 2016-09-07 DIAGNOSIS — I10 Essential (primary) hypertension: Secondary | ICD-10-CM | POA: Insufficient documentation

## 2016-09-07 DIAGNOSIS — R040 Epistaxis: Secondary | ICD-10-CM | POA: Insufficient documentation

## 2016-09-07 LAB — CBC
HCT: 51.2 % (ref 40.0–52.0)
Hemoglobin: 16.8 g/dL (ref 13.0–18.0)
MCH: 31.4 pg (ref 26.0–34.0)
MCHC: 32.9 g/dL (ref 32.0–36.0)
MCV: 95.4 fL (ref 80.0–100.0)
PLATELETS: 250 10*3/uL (ref 150–440)
RBC: 5.36 MIL/uL (ref 4.40–5.90)
RDW: 13.8 % (ref 11.5–14.5)
WBC: 12.5 10*3/uL — AB (ref 3.8–10.6)

## 2016-09-07 MED ORDER — LIDOCAINE-EPINEPHRINE (PF) 2 %-1:200000 IJ SOLN
10.0000 mL | Freq: Once | INTRAMUSCULAR | Status: AC
  Administered 2016-09-07: 10 mL
  Filled 2016-09-07 (×2): qty 10

## 2016-09-07 MED ORDER — SILVER NITRATE-POT NITRATE 75-25 % EX MISC
1.0000 | Freq: Once | CUTANEOUS | Status: AC
  Administered 2016-09-07: 1 via TOPICAL
  Filled 2016-09-07: qty 1

## 2016-09-07 MED ORDER — AMOXICILLIN-POT CLAVULANATE 875-125 MG PO TABS: 1.0000 | ORAL_TABLET | Freq: Two times a day (BID) | ORAL | 0 refills | Status: AC

## 2016-09-07 MED ORDER — OXYMETAZOLINE HCL 0.05 % NA SOLN
1.0000 | Freq: Two times a day (BID) | NASAL | Status: DC
  Administered 2016-09-07: 1 via NASAL
  Filled 2016-09-07: qty 15

## 2016-09-07 NOTE — ED Provider Notes (Signed)
[facility] Emergency Department Provider Note    First MD Initiated Contact with Patient 09/07/16 1426     (approximate)  I have reviewed the triage vital signs and the nursing notes.   HISTORY  Chief Complaint Epistaxis    HPI Ryan Cantu is a 82 y.o. male with acute epistaxis that started around 5 AM this morning. Patient takes daily aspirin but is not on any other blood thinners. States that had bleeding and clots coming from the left naris. States he put a piece of cotton under his top lip which stopped the bleeding. Denies any previous history of epistaxis. Denies any shortness of breath or chest pain.   Past Medical History:  Diagnosis Date  . cva 2014, 10/30   Right-sided facial droop, and right extremities weakness.  . CVA (cerebrovascular accident due to intracerebral hemorrhage) (HCC) 2010  . GERD (gastroesophageal reflux disease)   . Hypertension   . Hypothyroidism    Family History  Problem Relation Age of Onset  . Cancer Mother   . Cancer - Other Father   . Melanoma Brother   . Colon cancer Brother    Past Surgical History:  Procedure Laterality Date  . EYE SURGERY     Patient Active Problem List   Diagnosis Date Noted  . Weakness of right lower extremity 05/19/2015  . History of CVA (cerebrovascular accident) 05/19/2015  . Dizziness 07/30/2013  . GERD (gastroesophageal reflux disease) 07/30/2013  . CVA (cerebral vascular accident) (HCC) 07/23/2013  . Hypothyroidism 07/23/2013  . Essential hypertension 07/23/2013  . 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 Authorizing Provider  amLODipine (NORVASC) 5 MG tablet Take 1 tablet (5 mg total) by mouth 2 (two) times daily. 05/23/15   Marguarite Arbour, MD  aspirin 81 MG tablet Take 81 mg by mouth daily.    Historical Provider, MD  atorvastatin (LIPITOR) 10 MG tablet Take 10 mg by  mouth daily.    Historical Provider, MD  bisacodyl (DULCOLAX) 10 MG suppository Place 1 suppository (10 mg total) rectally daily as needed for moderate constipation. 05/23/15   Marguarite Arbour, MD  docusate sodium (COLACE) 100 MG capsule Take 1 capsule (100 mg total) by mouth 2 (two) times daily. 05/23/15   Marguarite Arbour, MD  fluticasone (FLONASE) 50 MCG/ACT nasal spray Place 2 sprays into the nose daily.    Historical Provider, MD  isosorbide mononitrate (IMDUR) 30 MG 24 hr tablet Take 30 mg by mouth daily.     Historical Provider, MD  lacosamide (VIMPAT) 50 MG TABS tablet Take 1 tablet (50 mg total) by mouth 2 (two) times daily. 05/23/15   Marguarite Arbour, MD  levothyroxine (SYNTHROID, LEVOTHROID) 100 MCG tablet Take 100 mcg by mouth daily before breakfast.    Historical Provider, MD  loratadine (CLARITIN) 10 MG tablet Take 10 mg by mouth daily.    Historical Provider, MD  meclizine (ANTIVERT) 12.5 MG tablet Take 12.5 mg by mouth 3 (three) times daily as needed for dizziness.    Historical Provider, MD  omeprazole (PRILOSEC) 20 MG capsule Take 20 mg by mouth 2 (two) times daily before a meal.     Historical Provider, MD  ondansetron (ZOFRAN) 4 MG tablet Take 1 tablet (4 mg total) by mouth every 6 (six) hours as needed for nausea. 05/23/15   Marguarite Arbour, MD    Allergies  Sulfa antibiotics    Social History Social History  Substance Use Topics  . Smoking status: Never Smoker  . Smokeless tobacco: Never Used  . Alcohol use No    Review of Systems Patient denies headaches, rhinorrhea, blurry vision, numbness, shortness of breath, chest pain, edema, cough, abdominal pain, nausea, vomiting, diarrhea, dysuria, fevers, rashes or hallucinations unless otherwise stated above in HPI. ____________________________________________   PHYSICAL EXAM:  VITAL SIGNS: Vitals:   09/07/16 1412  BP: 135/86  Pulse: 97  Resp: 18  Temp: 98.1 F (36.7 C)    Constitutional: Alert and oriented. Well  appearing and in no acute distress. Eyes: Conjunctivae are normal. PERRL. EOMI. Head: Atraumatic. Nose: No congestion/rhinnorhea.  There is a small focal polyp in the left Kiesselbach's region is likely culprit vessel of the epistaxis Mouth/Throat: Mucous membranes are moist. No evidence of posterior pharynx blood Oropharynx non-erythematous. Neck: No stridor. Painless ROM. No cervical spine tenderness to palpation Hematological/Lymphatic/Immunilogical: No cervical lymphadenopathy. Cardiovascular: Normal rate, regular rhythm. Grossly normal heart sounds.  Good peripheral circulation. Respiratory: Normal respiratory effort.  No retractions. Lungs CTAB. Gastrointestinal: Soft and nontender. No distention. No abdominal bruits. No CVA tenderness.  Neurologic:  Normal speech and language. No gross focal neurologic deficits are appreciated. No gait instability. Skin:  Skin is warm, dry and intact. No rash noted.  ____________________________________________   LABS (all labs ordered are listed, but only abnormal results are displayed)  Results for orders placed or performed during the hospital encounter of 09/07/16 (from the past 24 hour(s))  CBC     Status: Abnormal   Collection Time: 09/07/16  2:14 PM  Result Value Ref Range   WBC 12.5 (H) 3.8 - 10.6 K/uL   RBC 5.36 4.40 - 5.90 MIL/uL   Hemoglobin 16.8 13.0 - 18.0 g/dL   HCT 09.851.2 11.940.0 - 14.752.0 %   MCV 95.4 80.0 - 100.0 fL   MCH 31.4 26.0 - 34.0 pg   MCHC 32.9 32.0 - 36.0 g/dL   RDW 82.913.8 56.211.5 - 13.014.5 %   Platelets 250 150 - 440 K/uL   ____________________________________________  EKG____________________________________________  RADIOLOGY   ____________________________________________   PROCEDURES  Procedure(s) performed:  .Epistaxis Management Date/Time: 09/07/2016 4:43 PM Performed by: Willy EddyOBINSON, Wylie Coon Authorized by: Willy EddyOBINSON, Vi Whitesel   Consent:    Consent obtained:  Verbal   Consent given by:  Patient Procedure  details:    Treatment site:  L anterior   Treatment method:  Anterior pack and silver nitrate   Treatment episode: initial   Post-procedure details:    Assessment:  Bleeding stopped   Patient tolerance of procedure:  Tolerated well, no immediate complications      Critical Care performed: no ____________________________________________   INITIAL IMPRESSION / ASSESSMENT AND PLAN / ED COURSE  Pertinent labs & imaging results that were available during my care of the patient were reviewed by me and considered in my medical decision making (see chart for details).  DDX: anterior nose bleed, posterior bleed, trauma  Ryan Cantu is a 82 y.o. who presents to the ED with acute anterior epistaxis from the left naris. Patient hemodynamic stable at this time without any evidence of posterior bleed. As there is evidence of polyp which is likely the culprit lesion will provide Afrin and lidocaine with epi and then cauterize.  Clinical Course    ----------------------------------------- 4:43 PM on 09/07/2016 -----------------------------------------  Location of bleeding initially attempted hemostasis with silver nitrate cautery but had persistent bleeding therefore anterior nasal packing was placed  with [employer] as described above.  We'll discharge on Augmentin and arrange follow-up with ENT.  Patient was able to tolerate PO and was able to ambulate with a steady gait.  Have discussed with the patient and available family all diagnostics and treatments performed thus far and all questions were answered to the best of my ability. The patient demonstrates understanding and agreement with plan.   ____________________________________________   FINAL CLINICAL IMPRESSION(S) / ED DIAGNOSES  Final diagnoses:  Anterior epistaxis      NEW MEDICATIONS STARTED DURING THIS VISIT:  New Prescriptions   No medications on file     Note:  This document was prepared using Dragon voice  recognition software and may include unintentional dictation errors.    Willy EddyPatrick Acelin Ferdig, MD 09/07/16 [phone]

## 2016-09-07 NOTE — ED Triage Notes (Signed)
Nose bleed since 5AM this morning. Intermittent bleeding with clots per family. No hx of nose bleeds. Pt takes baby ASA daily.

## 2016-09-07 NOTE — Discharge Instructions (Signed)
If bleeding starts, please hold pressure for ten minutes.  If bleeding persists, please return for evaluation.  Follow up with ENT in 5-7 days.

## 2016-09-07 NOTE — ED Notes (Signed)
pts nose began bleeding again, Dr. Roxan Hockeyobinson made aware

## 2016-09-08 ENCOUNTER — Observation Stay
Admission: EM | Admit: 2016-09-08 | Discharge: 2016-09-09 | Disposition: A | Discharge status: Home / Self Care | Payer: Medicare Other | Attending: Internal Medicine | Admitting: Internal Medicine

## 2016-09-08 ENCOUNTER — Encounter: Payer: Self-pay | Admitting: Medical Oncology

## 2016-09-08 DIAGNOSIS — R040 Epistaxis: Secondary | ICD-10-CM | POA: Diagnosis not present

## 2016-09-08 DIAGNOSIS — Z800 Family history of malignant neoplasm of digestive organs: Secondary | ICD-10-CM | POA: Diagnosis not present

## 2016-09-08 DIAGNOSIS — K219 Gastro-esophageal reflux disease without esophagitis: Secondary | ICD-10-CM | POA: Insufficient documentation

## 2016-09-08 DIAGNOSIS — Z7982 Long term (current) use of aspirin: Secondary | ICD-10-CM | POA: Diagnosis not present

## 2016-09-08 DIAGNOSIS — Z808 Family history of malignant neoplasm of other organs or systems: Secondary | ICD-10-CM | POA: Insufficient documentation

## 2016-09-08 DIAGNOSIS — I10 Essential (primary) hypertension: Secondary | ICD-10-CM | POA: Diagnosis not present

## 2016-09-08 DIAGNOSIS — I69351 Hemiplegia and hemiparesis following cerebral infarction affecting right dominant side: Secondary | ICD-10-CM | POA: Diagnosis not present

## 2016-09-08 DIAGNOSIS — Z882 Allergy status to sulfonamides status: Secondary | ICD-10-CM | POA: Diagnosis not present

## 2016-09-08 DIAGNOSIS — E039 Hypothyroidism, unspecified: Secondary | ICD-10-CM | POA: Insufficient documentation

## 2016-09-08 DIAGNOSIS — I69392 Facial weakness following cerebral infarction: Secondary | ICD-10-CM | POA: Insufficient documentation

## 2016-09-08 LAB — CBC WITH DIFFERENTIAL/PLATELET
BASOS ABS: 0.1 10*3/uL (ref 0–0.1)
BASOS PCT: 0 %
Eosinophils Absolute: 0.1 10*3/uL (ref 0–0.7)
Eosinophils Relative: 1 %
HEMATOCRIT: 47.8 % (ref 40.0–52.0)
HEMOGLOBIN: 15.6 g/dL (ref 13.0–18.0)
LYMPHS PCT: 16 %
Lymphs Abs: 2.3 10*3/uL (ref 1.0–3.6)
MCH: 30.8 pg (ref 26.0–34.0)
MCHC: 32.6 g/dL (ref 32.0–36.0)
MCV: 94.6 fL (ref 80.0–100.0)
Monocytes Absolute: 1 10*3/uL (ref 0.2–1.0)
Monocytes Relative: 7 %
NEUTROS ABS: 11.4 10*3/uL — AB (ref 1.4–6.5)
NEUTROS PCT: 76 %
Platelets: 260 10*3/uL (ref 150–440)
RBC: 5.05 MIL/uL (ref 4.40–5.90)
RDW: 13.8 % (ref 11.5–14.5)
WBC: 14.8 10*3/uL — ABNORMAL HIGH (ref 3.8–10.6)

## 2016-09-08 LAB — CREATININE, SERUM
Creatinine, Ser: 0.9 mg/dL (ref 0.61–1.24)
GFR calc non Af Amer: >60 mL/min (ref >60)

## 2016-09-08 LAB — PROTIME-INR
INR: 1.12
Prothrombin Time: 14.5 seconds (ref 11.4–15.2)

## 2016-09-08 LAB — HEMOGLOBIN: HEMOGLOBIN: 14.7 g/dL (ref 13.0–18.0)

## 2016-09-08 LAB — TYPE AND SCREEN
ABO/RH(D): A POS
Antibody Screen: NEGATIVE

## 2016-09-08 MED ORDER — LEVOTHYROXINE SODIUM 50 MCG PO TABS
100.0000 ug | ORAL_TABLET | Freq: Every day | ORAL | Status: DC
  Administered 2016-09-09: 100 ug via ORAL
  Filled 2016-09-08: qty 2

## 2016-09-08 MED ORDER — OXYCODONE HCL 5 MG PO TABS: 5.0000 mg | ORAL_TABLET | ORAL | Status: DC | PRN

## 2016-09-08 MED ORDER — ACETAMINOPHEN 650 MG RE SUPP: 650.0000 mg | Freq: Four times a day (QID) | RECTAL | Status: DC | PRN

## 2016-09-08 MED ORDER — PANTOPRAZOLE SODIUM 40 MG PO TBEC
40.0000 mg | DELAYED_RELEASE_TABLET | Freq: Every day | ORAL | Status: DC
  Administered 2016-09-08 – 2016-09-09 (×2): 40 mg via ORAL
  Filled 2016-09-08 (×2): qty 1

## 2016-09-08 MED ORDER — ACETAMINOPHEN 325 MG PO TABS: 650.0000 mg | ORAL_TABLET | Freq: Four times a day (QID) | ORAL | Status: DC | PRN

## 2016-09-08 MED ORDER — MAGNESIUM GLUCONATE 500 MG PO TABS
500.0000 mg | ORAL_TABLET | Freq: Every day | ORAL | Status: DC
  Administered 2016-09-09: 500 mg via ORAL
  Filled 2016-09-08: qty 1

## 2016-09-08 MED ORDER — AMOXICILLIN-POT CLAVULANATE 875-125 MG PO TABS
1.0000 | ORAL_TABLET | Freq: Two times a day (BID) | ORAL | Status: DC
  Administered 2016-09-08 – 2016-09-09 (×2): 1 via ORAL
  Filled 2016-09-08 (×2): qty 1

## 2016-09-08 NOTE — ED Triage Notes (Signed)
Pt was seen yesterday for nose bleed, pt was sent home with nasal packing. Nose began bleeding again today from both nares, packing came out. Pt is not on blood thinner.

## 2016-09-08 NOTE — H&P (Signed)
[facility]   PATIENT NAME: Ryan Cantu    MR#:  [id number]  DATE OF BIRTH:  [birth year]   DATE OF ADMISSION:  09/08/2016  PRIMARY CARE PHYSICIAN: Oneal GroutPANDEY, MAHIMA, MD   REQUESTING/REFERRING PHYSICIAN: Roxan Hockeyobinson  CHIEF COMPLAINT:   Chief Complaint  Patient presents with  . Epistaxis    HISTORY OF PRESENT ILLNESS:  Ryan Cantu  is a 82 y.o. male with a known history of CVA with residual right-sided weakness. Is presenting with epistaxis. Patient presents to the emergency department yesterday received anterior packing for epistaxis returned again today after packing was out enlargement bleeding. Emergency department course: Posterior packing placed, case discussed with ENT who recommended observation given the repeated bleeding. If patient recurs to bleed ENT will assess in the hospital  PAST MEDICAL HISTORY:   Past Medical History:  Diagnosis Date  . cva 2014, 10/30   Right-sided facial droop, and right extremities weakness.  . CVA (cerebrovascular accident due to intracerebral hemorrhage) (HCC) 2010  . GERD (gastroesophageal reflux disease)   . Hypertension   . Hypothyroidism     PAST SURGICAL HISTORY:   Past Surgical History:  Procedure Laterality Date  . EYE SURGERY      SOCIAL HISTORY:   Social History  Substance Use Topics  . Smoking status: Never Smoker  . Smokeless tobacco: Never Used  . Alcohol use No    FAMILY HISTORY:   Family History  Problem Relation Age of Onset  . Cancer Mother   . Cancer - Other Father   . Melanoma Brother   . Colon cancer Brother     DRUG ALLERGIES:   Allergies  Allergen Reactions  . Sulfa Antibiotics Other (See Comments)    Reaction: Unknown    REVIEW OF SYSTEMS:  REVIEW OF SYSTEMS:  CONSTITUTIONAL: Denies fevers, chills, fatigue, weakness.  EYES: Denies blurred vision, double vision, or eye pain.  EARS, NOSE, THROAT: Denies tinnitus, ear pain, hearing loss. Positive  epistaxis RESPIRATORY: denies cough, shortness of breath, wheezing  CARDIOVASCULAR: Denies chest pain, palpitations, edema.  GASTROINTESTINAL: Denies nausea, vomiting, diarrhea, abdominal pain.  GENITOURINARY: Denies dysuria, hematuria.  ENDOCRINE: Denies nocturia or thyroid problems. HEMATOLOGIC AND LYMPHATIC: Denies easy bruising or bleeding.  SKIN: Denies rash or lesions.  MUSCULOSKELETAL: Denies pain in neck, back, shoulder, knees, hips, or further arthritic symptoms.  NEUROLOGIC: Denies paralysis, paresthesias.  PSYCHIATRIC: Denies anxiety or depressive symptoms. Otherwise full review of systems performed by me is negative.   MEDICATIONS AT HOME:   Prior to Admission medications   Medication Sig Start Date End Date Taking? Authorizing Provider  amoxicillin-clavulanate (AUGMENTIN) 875-125 MG tablet Take 1 tablet by mouth 2 (two) times daily. 09/07/16 09/14/16 Yes Willy EddyPatrick Robinson, MD  aspirin 81 MG tablet Take 81 mg by mouth daily.   Yes Historical Provider, MD  levothyroxine (SYNTHROID, LEVOTHROID) 100 MCG tablet Take 100 mcg by mouth daily before breakfast.   Yes Historical Provider, MD  magnesium gluconate (MAGONATE) 500 MG tablet Take 500 mg by mouth daily.   Yes Historical Provider, MD  Multiple Vitamins-Minerals (PRESERVISION AREDS PO) Take 1 tablet by mouth daily.   Yes Historical Provider, MD  omeprazole (PRILOSEC) 20 MG capsule Take 20 mg by mouth daily.    Yes Historical Provider, MD  amLODipine (NORVASC) 5 MG tablet Take 1 tablet (5 mg total) by mouth 2 (two) times daily. Patient not taking: Reported on 09/07/2016 05/23/15   Marguarite ArbourJeffrey D Sparks, MD  VITAL SIGNS:  Blood pressure 134/89, pulse (!) 109, temperature 97.6 F (36.4 C), temperature source Axillary, resp. rate 18, height 5\' 9"  (1.753 m), weight 90.3 kg (199 lb), SpO2 95 %.  PHYSICAL EXAMINATION:  VITAL SIGNS: Vitals:   09/08/16 1332  BP: 134/89  Pulse: (!) 109  Resp: 18  Temp: 97.6 F (36.4 C)    GENERAL:82 y.o.male currently in no acute distress. Patient covered in dried blood face, shirt HEAD: Normocephalic, atraumatic.  EYES: Pupils equal, round, reactive to light. Extraocular muscles intact. No scleral icterus.  MOUTH: Moist mucosal membrane. Dentition intact. No abscess noted.  EAR, NOSE, THROAT: Clear without exudates. No external lesions.  NECK: Supple. No thyromegaly. No nodules. No JVD.  PULMONARY: Clear to ascultation, without wheeze rails or rhonci. No use of accessory muscles, Good respiratory effort. good air entry bilaterally CHEST: Nontender to palpation.  CARDIOVASCULAR: S1 and S2. Regular rate and rhythm. No murmurs, rubs, or gallops. No edema. Pedal pulses 2+ bilaterally.  GASTROINTESTINAL: Soft, nontender, nondistended. No masses. Positive bowel sounds. No hepatosplenomegaly.  MUSCULOSKELETAL: No swelling, clubbing, or edema. Range of motion full in all extremities.  NEUROLOGIC: Cranial nerves II through XII are intact. No gross focal neurological deficits. Sensation intact. Reflexes intact.  SKIN: No ulceration, lesions, rashes, or cyanosis. Skin warm and dry. Turgor intact.  PSYCHIATRIC: Mood, affect within normal limits. The patient is awake, alert and oriented x 3. Insight, judgment intact.    LABORATORY PANEL:   CBC  Recent Labs Lab 09/07/16 1414  WBC 12.5*  HGB 16.8  HCT 51.2  PLT 250   ------------------------------------------------------------------------------------------------------------------  Chemistries  No results for input(s): NA, K, CL, CO2, GLUCOSE, BUN, CREATININE, CALCIUM, MG, AST, ALT, ALKPHOS, BILITOT in the last 168 hours.  Invalid input(s): GFRCGP ------------------------------------------------------------------------------------------------------------------  Cardiac Enzymes No results for input(s): TROPONINI in the last 168  hours. ------------------------------------------------------------------------------------------------------------------  RADIOLOGY:  No results found.  EKG:   Orders placed or performed during the hospital encounter of 11/07/15  . ED EKG  . ED EKG  . EKG    IMPRESSION AND PLAN:   82 year old Caucasian gentleman history of CVA on aspirin presenting with epistaxis 2  1. Posterior epistaxis: Posterior packing in place, continue Augmentin, case discussed with ENT if bleeding recurs they will assess in the hospital, follow hemoglobin anticipate discharge in the morning if no complications 2. Hypothyroidism unspecified Synthroid 3. GERD without esophagitis: PPI therapy     All the records are reviewed and case discussed with ED provider. Management plans discussed with the patient, family and they are in agreement.  CODE STATUS: full  TOTAL TIME TAKING CARE OF THIS PATIENT: 33 minutes.    Hower,  Mardi MainlandDavid K M.D on 09/08/2016 at 3:31 PM  Between 7am to 6pm - Pager - [phone]  After 6pm: House Pager: - [phone]  [facility]  Office  [phone]  CC: Primary care physician; Oneal GroutPANDEY, MAHIMA, MD

## 2016-09-08 NOTE — ED Provider Notes (Signed)
[facility] Emergency Department Provider Note    First MD Initiated Contact with Patient 09/08/16 1340     (approximate)  I have reviewed the triage vital signs and the nursing notes.   HISTORY  Chief Complaint Epistaxis    HPI Ryan Cantu is a 82 y.o. male presents with persistent nose bleeding after his nasal packing came out.. Patient was seen by me yesterday. Had evidence of anterior epistaxis on the left naris is cauterized in place with [employer] with evidence of hemostasis after observation. Patient went home and states he started having worsening bleeding and states that his Rhino Rocket fell out. This morning and was coughing up several clots. States he is having bleeding from both nares. Denies any weakness or shortness of breath.   Past Medical History:  Diagnosis Date  . cva 2014, 10/30   Right-sided facial droop, and right extremities weakness.  . CVA (cerebrovascular accident due to intracerebral hemorrhage) (HCC) 2010  . GERD (gastroesophageal reflux disease)   . Hypertension   . Hypothyroidism    Family History  Problem Relation Age of Onset  . Cancer Mother   . Cancer - Other Father   . Melanoma Brother   . Colon cancer Brother    Past Surgical History:  Procedure Laterality Date  . EYE SURGERY     Patient Active Problem List   Diagnosis Date Noted  . Weakness of right lower extremity 05/19/2015  . History of CVA (cerebrovascular accident) 05/19/2015  . Dizziness 07/30/2013  . GERD (gastroesophageal reflux disease) 07/30/2013  . CVA (cerebral vascular accident) (HCC) 07/23/2013  . Hypothyroidism 07/23/2013  . Essential hypertension 07/23/2013  . 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 Authorizing Provider  amoxicillin-clavulanate (AUGMENTIN) 875-125 MG tablet Take 1 tablet by mouth 2 (two) times daily. 09/07/16  09/14/16 Yes Willy EddyPatrick Mikka Kissner, MD  aspirin 81 MG tablet Take 81 mg by mouth daily.   Yes Historical Provider, MD  levothyroxine (SYNTHROID, LEVOTHROID) 100 MCG tablet Take 100 mcg by mouth daily before breakfast.   Yes Historical Provider, MD  magnesium gluconate (MAGONATE) 500 MG tablet Take 500 mg by mouth daily.   Yes Historical Provider, MD  Multiple Vitamins-Minerals (PRESERVISION AREDS PO) Take 1 tablet by mouth daily.   Yes Historical Provider, MD  omeprazole (PRILOSEC) 20 MG capsule Take 20 mg by mouth daily.    Yes Historical Provider, MD  amLODipine (NORVASC) 5 MG tablet Take 1 tablet (5 mg total) by mouth 2 (two) times daily. Patient not taking: Reported on 09/07/2016 05/23/15   Marguarite ArbourJeffrey D Sparks, MD    Allergies Sulfa antibiotics    Social History Social History  Substance Use Topics  . Smoking status: Never Smoker  . Smokeless tobacco: Never Used  . Alcohol use No    Review of Systems Patient denies headaches, rhinorrhea, blurry vision, numbness, shortness of breath, chest pain, edema, cough, abdominal pain, nausea, vomiting, diarrhea, dysuria, fevers, rashes or hallucinations unless otherwise stated above in HPI. ____________________________________________   PHYSICAL EXAM:  VITAL SIGNS: Vitals:   09/08/16 1332  BP: 134/89  Pulse: (!) 109  Resp: 18  Temp: 97.6 F (36.4 C)    Constitutional: Alert and oriented. Well appearing and in no acute distress. Eyes: Conjunctivae are normal. PERRL. EOMI. Head: Atraumatic. Nose: No congestion/rhinnorhea.  Area of bleeding and left anterior Darene Lamerair is apparently hemostatic at this time  with persistent posterior oozing and blood clot that appears to be arising for left nare Mouth/Throat: Mucous membranes are moist.  Oropharynx non-erythematous. There is evidence of posterior pharyngeal blood clot and oozing. Neck: No stridor. Painless ROM. No cervical spine tenderness to palpation Hematological/Lymphatic/Immunilogical: No  cervical lymphadenopathy. Cardiovascular: Normal rate, regular rhythm. Grossly normal heart sounds.  Good peripheral circulation. Respiratory: Normal respiratory effort.  No retractions. Lungs CTAB. Gastrointestinal: Soft and nontender. No distention. No abdominal bruits. No CVA tenderness. Musculoskeletal: No lower extremity tenderness nor edema.  No joint effusions. Neurologic:  Normal speech and language. No gross focal neurologic deficits are appreciated. No gait instability. Skin:  Skin is warm, dry and intact. No rash noted. Psychiatric: Mood and affect are normal. Speech and behavior are normal.  ____________________________________________   LABS (all labs ordered are listed, but only abnormal results are displayed)  No results found for this or any previous visit (from the past 24 hour(s)). ____________________________________________  EKG____________________________________________  RADIOLOGY   ____________________________________________   PROCEDURES  Procedure(s) performed:  .Epistaxis Management Date/Time: 09/08/2016 3:12 PM Performed by: Willy EddyOBINSON, Ceejay Kegley Authorized by: Willy EddyOBINSON, Zymere Patlan   Consent:    Consent obtained:  Verbal   Consent given by:  Patient   Risks discussed:  Bleeding, infection and nasal injury Anesthesia (see MAR for exact dosages):    Anesthesia method:  None Procedure details:    Treatment site:  L posterior   Treatment method:  Nasal balloon   Treatment complexity:  Limited   Treatment episode: recurring   Post-procedure details:    Assessment:  Bleeding stopped   Patient tolerance of procedure:  Tolerated well, no immediate complications      Critical Care performed: no ____________________________________________   INITIAL IMPRESSION / ASSESSMENT AND PLAN / ED COURSE  Pertinent labs & imaging results that were available during my care of the patient were reviewed by me and considered in my medical decision making (see  chart for details).  DDX: Anterior bleed, posterior bleed, AVM  Ryan Cantu is a 82 y.o. who presents to the ED with recurrent nose bleeding after pack fell out. On examination it seems of anterior area is hemostatic but is still having a fair amount of blood loss and the posterior pharynx. Posterior packing placed with resultant improvement and hemostasis. Spoke with ENT, Dr. Meribeth MattesSatteson,  who agrees with recommendation for 24-hour observation for serial hemoglobins and telemetry monitoring. Shari on antibiotics. I spoke with Dr. Clint GuyHower who agrees to observe patient in the hospital for further evaluation and management.  Clinical Course      ____________________________________________   FINAL CLINICAL IMPRESSION(S) / ED DIAGNOSES  Final diagnoses:  Acute posterior epistaxis      NEW MEDICATIONS STARTED DURING THIS VISIT:  New Prescriptions   No medications on file     Note:  This document was prepared using Dragon voice recognition software and may include unintentional dictation errors.    Willy EddyPatrick Krishon Adkison, MD 09/08/16 [phone]

## 2016-09-08 NOTE — ED Notes (Signed)
Pt transported to room 207 

## 2016-09-09 LAB — HEMOGLOBIN
HEMOGLOBIN: 14.4 g/dL (ref 13.0–18.0)
HEMOGLOBIN: 15 g/dL (ref 13.0–18.0)

## 2016-09-09 MED ORDER — PRESERVISION AREDS PO CAPS: ORAL_CAPSULE | Freq: Every day | ORAL | Status: DC

## 2016-09-09 MED ORDER — AMLODIPINE BESYLATE 10 MG PO TABS
10.0000 mg | ORAL_TABLET | Freq: Every day | ORAL | Status: DC
  Administered 2016-09-09: 10 mg via ORAL
  Filled 2016-09-09: qty 1

## 2016-09-09 MED ORDER — AMLODIPINE BESYLATE 10 MG PO TABS: 10.0000 mg | ORAL_TABLET | Freq: Every day | ORAL | 1 refills | Status: DC

## 2016-09-09 MED ORDER — AMLODIPINE BESYLATE 5 MG PO TABS: 5.0000 mg | ORAL_TABLET | Freq: Two times a day (BID) | ORAL | Status: DC

## 2016-09-09 MED ORDER — HYDRALAZINE HCL 20 MG/ML IJ SOLN
10.0000 mg | Freq: Once | INTRAMUSCULAR | Status: AC
  Administered 2016-09-09: 10 mg via INTRAVENOUS
  Filled 2016-09-09: qty 1

## 2016-09-09 MED ORDER — OCUVITE-LUTEIN PO CAPS
1.0000 | ORAL_CAPSULE | Freq: Every day | ORAL | Status: DC
  Administered 2016-09-09: 1 via ORAL
  Filled 2016-09-09: qty 1

## 2016-09-09 NOTE — Discharge Instructions (Signed)
Calling ENT office on Tuesday and follow-up.

## 2016-09-09 NOTE — Progress Notes (Signed)
Pt d/c home; d/c instructions reviewed w/ pt and pt family; pt understanding was verbalized; IV removed, catheter in tact, gauze dressing applied; dressing/packing in nose in place and WDL/CDI at d/c;  all pt and pt family questions answered; pt verbalized that all pt belongings were accounted for; pt left unit via wheelchair accompanied by staff

## 2016-09-09 NOTE — Progress Notes (Signed)
Notified Dr Enedina FinnerSona Patel of pt BP 190/110 R arm, 180/96 L arm, manual checks. Asked if Dr wanted to still d/c pt. Dr stated not to d/c pt yet, ordered IV hydralazine 10 mg once.

## 2016-09-09 NOTE — Discharge Summary (Signed)
[facility] [place] [facility]   PATIENT NAME: Ryan Cantu    MR#:  [id number]  DATE OF BIRTH:  Sep 29, 1923  DATE OF ADMISSION:  09/08/2016 ADMITTING PHYSICIAN: Wyatt Hasteavid K Hower, MD  DATE OF DISCHARGE: 09/09/16  PRIMARY CARE PHYSICIAN: Danella PentonMark F Miller, MD    ADMISSION DIAGNOSIS:  Acute posterior epistaxis [R04.0]  DISCHARGE DIAGNOSIS:  Acute epistaxis status post posterior nasal packing in the emergency room. Patient to follow-up ENT as outpatient  Malignant hypertension  SECONDARY DIAGNOSIS:   Past Medical History:  Diagnosis Date  . cva 2014, 10/30   Right-sided facial droop, and right extremities weakness.  . CVA (cerebrovascular accident due to intracerebral hemorrhage) (HCC) 2010  . GERD (gastroesophageal reflux disease)   . Hypertension   . Hypothyroidism     HOSPITAL COURSE:  82 year old Caucasian gentleman history of CVA on aspirin presenting with epistaxis 2  1. Acute epistaxis: Posterior packing in place, continue Augmentin, case discussed with ENT dr Meribeth MattesSatteson Recommends follow-up as outpatient. Epistaxis appears due to elevated blood pressure. Patient is now resumed back on amlodipine 10 mg daily. -Advised to hold off on aspirin at present  2. Hypothyroidism unspecified Synthroid  3. GERD without esophagitis: PPI therapy  4. Uncontrolled blood pressure. Continue amlodipine.  Patient will go home with posterior packing in place. Will follow-up ENT as outpatient. Family will make appointment on Tuesday after the holidays.  CONSULTS OBTAINED:  Treatment Team:  Wyatt Hasteavid K Hower, MD  DRUG ALLERGIES:   Allergies  Allergen Reactions  . Sulfa Antibiotics Other (See Comments)    Reaction: Unknown    DISCHARGE MEDICATIONS:   Current Discharge Medication List    CONTINUE these medications which have CHANGED   Details  amLODipine (NORVASC) 10 MG tablet Take 1 tablet (10 mg total) by mouth daily. Qty: 30 tablet, Refills: 1      CONTINUE these medications which have NOT CHANGED   Details  amoxicillin-clavulanate (AUGMENTIN) 875-125 MG tablet Take 1 tablet by mouth 2 (two) times daily. Qty: 14 tablet, Refills: 0    levothyroxine (SYNTHROID, LEVOTHROID) 100 MCG tablet Take 100 mcg by mouth daily before breakfast.    magnesium gluconate (MAGONATE) 500 MG tablet Take 500 mg by mouth daily.    Multiple Vitamins-Minerals (PRESERVISION AREDS PO) Take 1 tablet by mouth daily.    omeprazole (PRILOSEC) 20 MG capsule Take 20 mg by mouth daily.       STOP taking these medications     aspirin 81 MG tablet         If you experience worsening of your admission symptoms, develop shortness of breath, life threatening emergency, suicidal or homicidal thoughts you must seek medical attention immediately by calling 911 or calling your MD immediately  if symptoms less severe.  You Must read complete instructions/literature along with all the possible adverse reactions/side effects for all the Medicines you take and that have been prescribed to you. Take any new Medicines after you have completely understood and accept all the possible adverse reactions/side effects.   Please note  You were cared for by a hospitalist during your hospital stay. If you have any questions about your discharge medications or the care you received while you were in the hospital after you are discharged, you can call the unit and asked to speak with the hospitalist on call if the hospitalist that took care of you is not available. Once you are discharged, your primary care physician will handle any further medical issues.  Please note that NO REFILLS for any discharge medications will be authorized once you are discharged, as it is imperative that you return to your primary care physician (or establish a relationship with a primary care physician if you do not have one) for your aftercare needs so that they can reassess your need for medications and monitor  your lab values. Today   SUBJECTIVE   No nasal bleeding. Patient doing well. Family in the room.  VITAL SIGNS:  Blood pressure (!) 170/87, pulse 79, temperature 98.4 F (36.9 C), temperature source Oral, resp. rate 20, height 5\' 9"  (1.753 m), weight 90.3 kg (199 lb), SpO2 92 %.  I/O:    Intake/Output Summary (Last 24 hours) at 09/09/16 1144 Last data filed at 09/09/16 1001  Gross per 24 hour  Intake             1200 ml  Output                0 ml  Net             1200 ml    PHYSICAL EXAMINATION:  GENERAL:  82 y.o.-year-old patient lying in the bed with no acute distress.  EYES: Pupils equal, round, reactive to light and accommodation. No scleral icterus. Extraocular muscles intact.  HEENT: Head atraumatic, normocephalic. Oropharynx and nasopharynx clear. Left and posterior packing present  NECK:  Supple, no jugular venous distention. No thyroid enlargement, no tenderness.  LUNGS: Normal breath sounds bilaterally, no wheezing, rales,rhonchi or crepitation. No use of accessory muscles of respiration.  CARDIOVASCULAR: S1, S2 normal. No murmurs, rubs, or gallops.  ABDOMEN: Soft, non-tender, non-distended. Bowel sounds present. No organomegaly or mass.  EXTREMITIES: No pedal edema, cyanosis, or clubbing.  NEUROLOGIC: Cranial nerves II through XII are intact. Muscle strength 5/5 in all extremities. Sensation intact. Gait not checked.  PSYCHIATRIC: The patient is alert and oriented x 3.  SKIN: No obvious rash, lesion, or ulcer.   DATA REVIEW:   CBC   Recent Labs Lab 09/08/16 1535  09/09/16 0744  WBC 14.8*  --   --   HGB 15.6  < > 15.0  HCT 47.8  --   --   PLT 260  --   --   < > = values in this interval not displayed.  Chemistries   Recent Labs Lab 09/08/16 1727  CREATININE 0.90    Microbiology Results   No results found for this or any previous visit (from the past 240 hour(s)).  RADIOLOGY:  No results found.   Management plans discussed with the patient,  family and they are in agreement.  CODE STATUS:     Code Status Orders        Start     Ordered   09/08/16 1520  Full code  Continuous     09/08/16 1519    Code Status History    Date Active Date Inactive Code Status Order ID Comments User Context   05/19/2015  2:56 AM 05/23/2015  8:14 PM Full Code [id number]  Crissie FiguresEdavally N Reddy, MD Inpatient    Advance Directive Documentation   Flowsheet Row Most Recent Value  Type of Advance Directive  Healthcare Power of Attorney  Pre-existing out of facility DNR order (yellow form or pink MOST form)  No data  "MOST" Form in Place?  No data      TOTAL TIME TAKING CARE OF THIS PATIENT: 40 minutes.    Tyland Klemens M.D on 09/09/2016 at 11:44 AM  Between  7am to 6pm - Pager - [phone] After 6pm go to [email or web address] - password [email or web address] [facility]  [facility]  Office  [phone]  CC: Primary care physician; Danella Penton, MD

## 2016-09-09 NOTE — Care Management Obs Status (Signed)
MEDICARE OBSERVATION STATUS NOTIFICATION   Patient Details  Name: Ryan Cantu MRN: [id number] Date of Birth: 1924-01-01   Medicare Observation Status Notification Given:  No (discharge order less than 24 hours)    Caren MacadamMichelle Bralon Antkowiak, RN 09/09/2016, 11:57 AM

## 2016-09-09 NOTE — Progress Notes (Signed)
Notified Dr Enedina FinnerSona Patel of pt manual BP recheck following hydralazine administration (144/84 R arm, 142/84 L arm), and asked if she wanted to d/c pt. Dr ordered to d/c pt home.

## 2017-01-14 ENCOUNTER — Inpatient Hospital Stay
Admission: EM | Admit: 2017-01-14 | Discharge: 2017-01-16 | DRG: 281 | Disposition: A | Discharge status: Home Health | Payer: Medicare Other | Attending: Internal Medicine | Admitting: Internal Medicine

## 2017-01-14 ENCOUNTER — Emergency Department: Payer: Medicare Other

## 2017-01-14 ENCOUNTER — Encounter: Payer: Self-pay | Admitting: Emergency Medicine

## 2017-01-14 DIAGNOSIS — I69392 Facial weakness following cerebral infarction: Secondary | ICD-10-CM

## 2017-01-14 DIAGNOSIS — E039 Hypothyroidism, unspecified: Secondary | ICD-10-CM | POA: Diagnosis present

## 2017-01-14 DIAGNOSIS — Z882 Allergy status to sulfonamides status: Secondary | ICD-10-CM | POA: Diagnosis not present

## 2017-01-14 DIAGNOSIS — Z79899 Other long term (current) drug therapy: Secondary | ICD-10-CM

## 2017-01-14 DIAGNOSIS — Z7189 Other specified counseling: Secondary | ICD-10-CM | POA: Diagnosis not present

## 2017-01-14 DIAGNOSIS — I69351 Hemiplegia and hemiparesis following cerebral infarction affecting right dominant side: Secondary | ICD-10-CM

## 2017-01-14 DIAGNOSIS — F0390 Unspecified dementia without behavioral disturbance: Secondary | ICD-10-CM | POA: Diagnosis present

## 2017-01-14 DIAGNOSIS — Z66 Do not resuscitate: Secondary | ICD-10-CM | POA: Diagnosis present

## 2017-01-14 DIAGNOSIS — Z515 Encounter for palliative care: Secondary | ICD-10-CM | POA: Diagnosis present

## 2017-01-14 DIAGNOSIS — K219 Gastro-esophageal reflux disease without esophagitis: Secondary | ICD-10-CM | POA: Diagnosis present

## 2017-01-14 DIAGNOSIS — I10 Essential (primary) hypertension: Secondary | ICD-10-CM | POA: Diagnosis present

## 2017-01-14 DIAGNOSIS — I214 Non-ST elevation (NSTEMI) myocardial infarction: Principal | ICD-10-CM | POA: Diagnosis present

## 2017-01-14 DIAGNOSIS — I160 Hypertensive urgency: Secondary | ICD-10-CM | POA: Diagnosis present

## 2017-01-14 LAB — CBC
HCT: 46.2 % (ref 40.0–52.0)
Hemoglobin: 15.1 g/dL (ref 13.0–18.0)
MCH: 28.2 pg (ref 26.0–34.0)
MCHC: 32.8 g/dL (ref 32.0–36.0)
MCV: 86 fL (ref 80.0–100.0)
PLATELETS: 266 10*3/uL (ref 150–440)
RBC: 5.37 MIL/uL (ref 4.40–5.90)
RDW: 15.8 % — ABNORMAL HIGH (ref 11.5–14.5)
WBC: 11.2 10*3/uL — ABNORMAL HIGH (ref 3.8–10.6)

## 2017-01-14 LAB — PROTIME-INR
INR: 1.05
Prothrombin Time: 13.7 seconds (ref 11.4–15.2)

## 2017-01-14 LAB — TROPONIN I
TROPONIN I: 2.62 ng/mL — AB (ref <0.03)
TROPONIN I: 11.24 ng/mL — AB (ref <0.03)
TROPONIN I: 28.41 ng/mL — AB (ref <0.03)

## 2017-01-14 LAB — BASIC METABOLIC PANEL
Anion gap: 8 (ref 5–15)
BUN: 14 mg/dL (ref 6–20)
CALCIUM: 9.1 mg/dL (ref 8.9–10.3)
CO2: 28 mmol/L (ref 22–32)
CREATININE: 0.89 mg/dL (ref 0.61–1.24)
Chloride: 101 mmol/L (ref 101–111)
Glucose, Bld: 128 mg/dL — ABNORMAL HIGH (ref 65–99)
Potassium: 3.8 mmol/L (ref 3.5–5.1)
Sodium: 137 mmol/L (ref 135–145)

## 2017-01-14 LAB — HEPARIN LEVEL (UNFRACTIONATED): Heparin Unfractionated: 0.53 IU/mL (ref 0.30–0.70)

## 2017-01-14 LAB — APTT: aPTT: 41 seconds — ABNORMAL HIGH (ref 24–36)

## 2017-01-14 MED ORDER — PANTOPRAZOLE SODIUM 40 MG PO TBEC
40.0000 mg | DELAYED_RELEASE_TABLET | Freq: Every day | ORAL | Status: DC
  Administered 2017-01-15 – 2017-01-16 (×2): 40 mg via ORAL
  Filled 2017-01-14 (×2): qty 1

## 2017-01-14 MED ORDER — LABETALOL HCL 5 MG/ML IV SOLN
10.0000 mg | Freq: Once | INTRAVENOUS | Status: AC
  Administered 2017-01-14: 10 mg via INTRAVENOUS
  Filled 2017-01-14: qty 4

## 2017-01-14 MED ORDER — HEPARIN BOLUS VIA INFUSION
4000.0000 [IU] | Freq: Once | INTRAVENOUS | Status: AC
  Administered 2017-01-14: 4000 [IU] via INTRAVENOUS
  Filled 2017-01-14: qty 4000

## 2017-01-14 MED ORDER — NITROGLYCERIN 0.4 MG SL SUBL: 0.4000 mg | SUBLINGUAL_TABLET | SUBLINGUAL | Status: DC | PRN

## 2017-01-14 MED ORDER — ASPIRIN 81 MG PO CHEW
324.0000 mg | CHEWABLE_TABLET | Freq: Once | ORAL | Status: AC
  Administered 2017-01-14: 324 mg via ORAL
  Filled 2017-01-14: qty 4

## 2017-01-14 MED ORDER — LEVOTHYROXINE SODIUM 100 MCG PO TABS
100.0000 ug | ORAL_TABLET | Freq: Every day | ORAL | Status: DC
  Administered 2017-01-15 – 2017-01-16 (×2): 100 ug via ORAL
  Filled 2017-01-14 (×2): qty 1

## 2017-01-14 MED ORDER — HEPARIN (PORCINE) IN NACL 100-0.45 UNIT/ML-% IJ SOLN
1150.0000 [IU]/h | INTRAMUSCULAR | Status: DC
  Administered 2017-01-14 – 2017-01-15 (×2): 1150 [IU]/h via INTRAVENOUS
  Filled 2017-01-14 (×2): qty 250

## 2017-01-14 MED ORDER — HYDRALAZINE HCL 20 MG/ML IJ SOLN: 10.0000 mg | Freq: Four times a day (QID) | INTRAMUSCULAR | Status: DC | PRN

## 2017-01-14 MED ORDER — ATORVASTATIN CALCIUM 20 MG PO TABS
40.0000 mg | ORAL_TABLET | Freq: Every day | ORAL | Status: DC
  Administered 2017-01-14 – 2017-01-15 (×2): 40 mg via ORAL
  Filled 2017-01-14 (×2): qty 2

## 2017-01-14 MED ORDER — METOPROLOL TARTRATE 25 MG PO TABS
25.0000 mg | ORAL_TABLET | Freq: Two times a day (BID) | ORAL | Status: DC
  Administered 2017-01-14 – 2017-01-16 (×5): 25 mg via ORAL
  Filled 2017-01-14 (×5): qty 1

## 2017-01-14 MED ORDER — ASPIRIN EC 81 MG PO TBEC
81.0000 mg | DELAYED_RELEASE_TABLET | Freq: Every day | ORAL | Status: DC
  Administered 2017-01-15 – 2017-01-16 (×2): 81 mg via ORAL
  Filled 2017-01-14 (×2): qty 1

## 2017-01-14 MED ORDER — ISOSORBIDE MONONITRATE ER 30 MG PO TB24
30.0000 mg | ORAL_TABLET | Freq: Every day | ORAL | Status: DC
  Administered 2017-01-14 – 2017-01-16 (×3): 30 mg via ORAL
  Filled 2017-01-14 (×3): qty 1

## 2017-01-14 MED ORDER — ONDANSETRON HCL 4 MG/2ML IJ SOLN: 4.0000 mg | Freq: Four times a day (QID) | INTRAMUSCULAR | Status: DC | PRN

## 2017-01-14 MED ORDER — ACETAMINOPHEN 325 MG PO TABS: 650.0000 mg | ORAL_TABLET | ORAL | Status: DC | PRN

## 2017-01-14 MED ORDER — SODIUM CHLORIDE 0.9 % IV SOLN
INTRAVENOUS | Status: DC
  Administered 2017-01-14: 15:00:00 via INTRAVENOUS

## 2017-01-14 NOTE — ED Triage Notes (Signed)
Pt presents with chest pain started on Saturday. Pt with high blood pressure this am.

## 2017-01-14 NOTE — H&P (Signed)
[facility] [place] at [facility]   PATIENT NAME: Ryan Cantu    MR#:  [id number]  DATE OF BIRTH:  01/10/1924  DATE OF ADMISSION:  01/14/2017  PRIMARY CARE PHYSICIAN: Danella Penton, MD   REQUESTING/REFERRING PHYSICIAN: Jene Every, MD  CHIEF COMPLAINT:   Chief Complaint  Patient presents with  . Chest Pain   Chest pain since last night. HISTORY OF PRESENT ILLNESS:  Ryan Cantu  is a 83 y.o. male with a known history of CVA, hypertension, hypothyroidism and GERD. The patient presents in the ED with the above chief complaints. The patient had chest pain 2 days ago. He started to have chest pain since last night, which is in substernal area, intermittent, aching without radiation. The patient denies any headache, dizziness, nausea or diaphoresis. Troponin level is up to 2.62. ED physician discussed with cardiologist Dr. Lady Gary, suggested starting heparin drip and the possible cardiac cath.  PAST MEDICAL HISTORY:   Past Medical History:  Diagnosis Date  . cva 2014, 10/30   Right-sided facial droop, and right extremities weakness.  . CVA (cerebrovascular accident due to intracerebral hemorrhage) (HCC) 2010  . GERD (gastroesophageal reflux disease)   . Hypertension   . Hypothyroidism     PAST SURGICAL HISTORY:   Past Surgical History:  Procedure Laterality Date  . EYE SURGERY      SOCIAL HISTORY:   Social History  Substance Use Topics  . Smoking status: Never Smoker  . Smokeless tobacco: Never Used  . Alcohol use No    FAMILY HISTORY:   Family History  Problem Relation Age of Onset  . Cancer Mother   . Cancer - Other Father   . Melanoma Brother   . Colon cancer Brother     DRUG ALLERGIES:   Allergies  Allergen Reactions  . Sulfa Antibiotics Other (See Comments)    Reaction: Unknown    REVIEW OF SYSTEMS:   Review of Systems  Constitutional: Negative for chills, fever and malaise/fatigue.  HENT: Positive for hearing loss.  Negative for congestion.   Eyes: Negative for blurred vision and double vision.  Respiratory: Negative for cough, hemoptysis, shortness of breath and stridor.   Cardiovascular: Positive for chest pain. Negative for palpitations and leg swelling.  Gastrointestinal: Negative for abdominal pain, blood in stool, diarrhea, heartburn, melena, nausea and vomiting.  Genitourinary: Negative for dysuria and frequency.  Musculoskeletal: Negative for back pain.  Neurological: Negative for dizziness, focal weakness, loss of consciousness, weakness and headaches.  Psychiatric/Behavioral: Negative for depression. The patient is not nervous/anxious.     MEDICATIONS AT HOME:   Prior to Admission medications   Medication Sig Start Date End Date Taking? Authorizing Provider  levothyroxine (SYNTHROID, LEVOTHROID) 100 MCG tablet Take 100 mcg by mouth daily before breakfast.   Yes Historical Provider, MD  magnesium oxide (MAG-OX) 400 MG tablet Take 400 mg by mouth daily.   Yes Historical Provider, MD  Multiple Vitamins-Minerals (PRESERVISION AREDS PO) Take 1 tablet by mouth daily.   Yes Historical Provider, MD  omeprazole (PRILOSEC) 20 MG capsule Take 20 mg by mouth daily.    Yes Historical Provider, MD  amLODipine (NORVASC) 10 MG tablet Take 1 tablet (10 mg total) by mouth daily. Patient not taking: Reported on 01/14/2017 09/10/16   Enedina Finner, MD      VITAL SIGNS:  Blood pressure (!) 167/102, pulse 61, temperature 97.9 F (36.6 C), temperature source Oral, resp. rate 18, height  (1.753 m), weight 198 lb (  89.8 kg), SpO2 96 %.  PHYSICAL EXAMINATION:  Physical Exam  GENERAL:  83 y.o.-year-old patient lying in the bed with no acute distress.  EYES: Pupils equal, round, reactive to light and accommodation. No scleral icterus. Extraocular muscles intact.  HEENT: Head atraumatic, normocephalic. Oropharynx and nasopharynx clear.  NECK:  Supple, no jugular venous distention. No thyroid enlargement, no  tenderness.  LUNGS: Normal breath sounds bilaterally, no wheezing, rales,rhonchi or crepitation. No use of accessory muscles of respiration.  CARDIOVASCULAR: S1, S2 normal. No murmurs, rubs, or gallops.  ABDOMEN: Soft, nontender, nondistended. Bowel sounds present. No organomegaly or mass.  EXTREMITIES: No pedal edema, cyanosis, or clubbing.  NEUROLOGIC: Cranial nerves II through XII are intact. Muscle strength 5/5 in all extremities. Sensation intact. Gait not checked.  PSYCHIATRIC: The patient is alert and oriented x 3.  SKIN: No obvious rash, lesion, or ulcer.   LABORATORY PANEL:   CBC  Recent Labs Lab 01/14/17 0945  WBC 11.2*  HGB 15.1  HCT 46.2  PLT 266   ------------------------------------------------------------------------------------------------------------------  Chemistries   Recent Labs Lab 01/14/17 0945  NA 137  K 3.8  CL 101  CO2 28  GLUCOSE 128*  BUN 14  CREATININE 0.89  CALCIUM 9.1   ------------------------------------------------------------------------------------------------------------------  Cardiac Enzymes  Recent Labs Lab 01/14/17 0945  TROPONINI 2.62*   ------------------------------------------------------------------------------------------------------------------  RADIOLOGY:  Dg Chest 2 View  Result Date: 01/14/2017 CLINICAL DATA:  Chest pain EXAM: CHEST  2 VIEW COMPARISON:  05/19/2015 FINDINGS: Heart size mildly enlarged. Negative for heart failure. Mild atelectasis in the lung bases. Negative for pneumonia or effusion. IMPRESSION: Mild bibasilar atelectasis. Electronically Signed   By: Marlan Palau M.D.   On: 01/14/2017 11:22      IMPRESSION AND PLAN:   NSTEMI Admit to tele floor. Given ASA, start heparin drip, lopressor, lipitor, follow upTroponin level and Dr, Lady Gary.  Hypertension urgency. Start Lopressor twice a day, IV hydralazine when necessary.  History of CVA. Treatment as above. Hypothyroidism. Continue  thyroxine.  All the records are reviewed and case discussed with ED provider. Management plans discussed with the patient, HIS WIFE ans daughter and they are in agreement.  CODE STATUS: DNR  TOTAL TIME TAKING CARE OF THIS PATIENT: 56 minutes.    Shaune Pollack M.D on 01/14/2017 at 12:24 PM  Between 7am to 6pm - Pager - [phone]  After 6pm go to [email or web address] - Social research officer, government  [facility]  Office  [phone]  CC: Primary care physician; Danella Penton, MD   Note: This dictation was prepared with Dragon dictation along with smaller phrase technology. Any transcriptional errors that result from this process are unintentional.

## 2017-01-14 NOTE — Progress Notes (Addendum)
ANTICOAGULATION CONSULT NOTE - Initial Consult  Pharmacy Consult for Heparin Drip Indication: chest pain/ACS  Allergies  Allergen Reactions  . Sulfa Antibiotics Other (See Comments)    Reaction: Unknown    Patient Measurements: Height:  (175.3 cm) Weight: 198 lb (89.8 kg) IBW/kg (Calculated) : 70.7 Heparin Dosing Weight: 88.8 kg  Vital Signs: Temp: 98 F (36.7 C) (04/30 1921) Temp Source: Oral (04/30 1921) BP: 135/67 (04/30 1921) Pulse Rate: 59 (04/30 1921)  Labs:  Recent Labs  01/14/17 0945 01/14/17 1117 01/14/17 1338 01/14/17 1955  HGB 15.1  --   --   --   HCT 46.2  --   --   --   PLT 266  --   --   --   APTT  --  41*  --   --   LABPROT  --  13.7  --   --   INR  --  1.05  --   --   HEPARINUNFRC  --   --   --  0.53  CREATININE 0.89  --   --   --   TROPONINI 2.62*  --  11.24*  --     Estimated Creatinine Clearance: 59.9 mL/min (by C-G formula based on SCr of 0.89 mg/dL).   Medical History: Past Medical History:  Diagnosis Date  . cva 2014, 10/30   Right-sided facial droop, and right extremities weakness.  . CVA (cerebrovascular accident due to intracerebral hemorrhage) (HCC) 2010  . GERD (gastroesophageal reflux disease)   . Hypertension   . Hypothyroidism     Medications:  Scheduled:  . [START ON 01/15/2017] aspirin EC  81 mg Oral Daily  . atorvastatin  40 mg Oral q1800  . isosorbide mononitrate  30 mg Oral Daily  . [START ON 01/15/2017] levothyroxine  100 mcg Oral QAC breakfast  . metoprolol tartrate  25 mg Oral BID  . [START ON 01/15/2017] pantoprazole  40 mg Oral Daily   Infusions:  . sodium chloride 50 mL/hr at 01/14/17 1453  . heparin 1,150 Units/hr (01/14/17 1211)    Assessment: Pharmacy consulted to order and monitor Heparin drip for this 83 y.o. male who presents with chest pain.   Goal of Therapy:  Heparin level 0.3-0.7 units/ml Monitor platelets by anticoagulation protocol: Yes   Plan:  Give 4000 units bolus x 1 Start heparin  infusion at 1150 units/hr Check anti-Xa level in 8 hours and daily while on heparin Continue to monitor H&H and platelets  HL ordered for tonight at 20:00.  4/30:  HL @ 20:00 = 0.53.   Will continue this pt on current rate of 1150 units/hr.  Will recheck HL in 8 hrs on 5/1 @ 0400.   5/1 AM heparin level 0.51. Continue current regimen. Recheck heparin level and CBC with tomorrow AM labs.  Scherrie Gerlach, [facility] Clinical Pharmacist 01/14/2017,8:36 PM

## 2017-01-14 NOTE — ED Notes (Signed)
Patient denies pain and is resting comfortably.  

## 2017-01-14 NOTE — Consult Note (Signed)
[facility] CARDIOLOGY [facility]  CARDIOLOGY CONSULT NOTE  Patient ID: Ryan Cantu MRN: [id number] DOB/AGE: 1924-01-28 83 y.o.  Admit date: 01/14/2017 Referring Physician Dr. Imogene Burn Primary Physician Dr. Bethann Punches Primary Cardiologist   Reason for Consultation NSTEMI  HPI: Patient is a 83 year old male with history of hypertension and a previous stroke who presents with approximate 24-36 hour history of chest pain. On presentation his electric cardiac exam showed no injury current. He was hemodynamically stable and pain-free. Initial troponin was 2.24. Subsequently was increased to greater than 11. He is currently hemodynamically stable on aspirin, heparin, metoprolol, atorvastatin. He is able to ambulate with a walker with some dyspnea on exertion and exertional chest tightness. Currently stable on current regimen. Desires conservative therapy if possible. We will allow the today and defer cardiac catheterization pending discussion with the patient and his daughter and pending course as well as after evaluating his LV function on echo.  Review of Systems  HENT: Negative.   Eyes: Negative.   Respiratory: Negative.   Cardiovascular: Positive for chest pain.  Gastrointestinal: Negative.   Genitourinary: Negative.   Musculoskeletal: Negative.   Skin: Negative.   Neurological: Positive for weakness.  Endo/Heme/Allergies: Negative.   Psychiatric/Behavioral: Positive for memory loss.    Past Medical History:  Diagnosis Date  . cva 2014, 10/30   Right-sided facial droop, and right extremities weakness.  . CVA (cerebrovascular accident due to intracerebral hemorrhage) (HCC) 2010  . GERD (gastroesophageal reflux disease)   . Hypertension   . Hypothyroidism     Family History  Problem Relation Age of Onset  . Cancer Mother   . Cancer - Other Father   . Melanoma Brother   . Colon cancer Brother     Social History   Social History  . Marital status:  Widowed    Spouse name: widowed  . Number of children: N/A  . Years of education: N/A   Occupational History  . Archivist     Retired   Social History Main Topics  . Smoking status: Never Smoker  . Smokeless tobacco: Never Used  . Alcohol use No  . Drug use: No  . Sexual activity: Not Currently   Other Topics Concern  . Not on file   Social History Narrative  . No narrative on file    Past Surgical History:  Procedure Laterality Date  . EYE SURGERY       Prescriptions Prior to Admission  Medication Sig Dispense Refill Last Dose  . levothyroxine (SYNTHROID, LEVOTHROID) 100 MCG tablet Take 100 mcg by mouth daily before breakfast.   01/14/2017 at 0400  . magnesium oxide (MAG-OX) 400 MG tablet Take 400 mg by mouth daily.   01/14/2017 at 0400  . Multiple Vitamins-Minerals (PRESERVISION AREDS PO) Take 1 tablet by mouth daily.   01/14/2017 at 0400  . omeprazole (PRILOSEC) 20 MG capsule Take 20 mg by mouth daily.    01/14/2017 at 0400  . amLODipine (NORVASC) 10 MG tablet Take 1 tablet (10 mg total) by mouth daily. (Patient not taking: Reported on 01/14/2017) 30 tablet 1 Not Taking at Unknown time    Physical Exam: Blood pressure (!) 188/87, pulse (!) 56, temperature 97.4 F (36.3 C), temperature source Oral, resp. rate 16, height  (1.753 m), weight 89.8 kg (198 lb), SpO2 100 %.   Wt Readings from Last 1 Encounters:  01/14/17 89.8 kg (198 lb)     General appearance: cooperative Resp: clear to  auscultation bilaterally Cardio: regular rate and rhythm GI: soft, non-tender; bowel sounds normal; no masses,  no organomegaly Extremities: extremities normal, atraumatic, no cyanosis or edema Pulses: 2+ and symmetric Neurologic: Grossly normal  Labs:   Lab Results  Component Value Date   WBC 11.2 (H) 01/14/2017   HGB 15.1 01/14/2017   HCT 46.2 01/14/2017   MCV 86.0 01/14/2017   PLT 266 01/14/2017    Recent Labs Lab 01/14/17 0945  NA 137  K 3.8  CL 101  CO2 28    BUN 14  CREATININE 0.89  CALCIUM 9.1  GLUCOSE 128*   Lab Results  Component Value Date   CKTOTAL 99 04/30/2009   CKMB 3.2 04/30/2009   TROPONINI 11.24 (HH) 01/14/2017      Radiology: no acute cardiopulmonary disease EKG: nsr with no ischemia  ASSESSMENT AND PLAN:  NSTEMI (non-ST elevated myocardial infarction) (HCC)-Patient appears to have ruled in for non-ST elevation myocardial infarction. Patient had chest pain most of yesterday and last night. He presented to the ER this morning where pain was relieved fairly quickly. Electrocautery gram showed no injury current. His initial troponin was 2.62 with subsequent troponin of 11.24. Patient is 83 years of age and although remains fairly active in the yard is fairly slow and ambulates with a walker. He is currently pain-free and hemodynamically stable. Patient and his friend are interested in conservative therapy if at all possible. Patient is currently hemodynamically stable on heparin, aspirin, beta blocker and intense statin. Will review echocardiogram when available to evaluate LV function. Based on stability and further symptoms, we'll consider medical therapy versus invasive evaluation. Signed: Dalia Heading MD, [facility] 01/14/2017, 4:46 PM

## 2017-01-14 NOTE — ED Provider Notes (Signed)
[facility] Emergency Department Provider Note   ____________________________________________    I have reviewed the triage vital signs and the nursing notes.   HISTORY  Chief Complaint Chest Pain     HPI Ryan Cantu is a 83 y.o. male who presents with chest pain. Patient reports he was doing yard work 2 days ago and developed chest discomfort centrally. No radiation. This resolved and he went about his day. However he reports the pain developed again last night but resolved at the time he came to the emergency department. He denies shortness of breath. No history of heart attacks. He does not smoke. No recent travel. No pleurisy. He does have a history of CVA. He does not take aspirin.   Past Medical History:  Diagnosis Date  . cva 2014, 10/30   Right-sided facial droop, and right extremities weakness.  . CVA (cerebrovascular accident due to intracerebral hemorrhage) (HCC) 2010  . GERD (gastroesophageal reflux disease)   . Hypertension   . Hypothyroidism     Patient Active Problem List   Diagnosis Date Noted  . Acute posterior epistaxis 09/08/2016  . Weakness of right lower extremity 05/19/2015  . History of CVA (cerebrovascular accident) 05/19/2015  . Dizziness 07/30/2013  . GERD (gastroesophageal reflux disease) 07/30/2013  . CVA (cerebral vascular accident) (HCC) 07/23/2013  . Hypothyroidism 07/23/2013  . Essential hypertension 07/23/2013  . 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 EYE SURGERY      Prior to Admission medications   Medication Sig Start Date End Date Taking? Authorizing Provider  levothyroxine (SYNTHROID, LEVOTHROID) 100 MCG tablet Take 100 mcg by mouth daily before breakfast.   Yes Historical Provider, MD  magnesium oxide (MAG-OX) 400 MG tablet Take 400 mg by mouth daily.   Yes Historical Provider, MD  Multiple Vitamins-Minerals (PRESERVISION  AREDS PO) Take 1 tablet by mouth daily.   Yes Historical Provider, MD  omeprazole (PRILOSEC) 20 MG capsule Take 20 mg by mouth daily.    Yes Historical Provider, MD  amLODipine (NORVASC) 10 MG tablet Take 1 tablet (10 mg total) by mouth daily. Patient not taking: Reported on 01/14/2017 09/10/16   Enedina Finner, MD     Allergies Sulfa antibiotics  Family History  Problem Relation Age of Onset  . Cancer Mother   . Cancer - Other Father   . Melanoma Brother   . Colon cancer Brother     Social History Social History  Substance Use Topics  . Smoking status: Never Smoker  . Smokeless tobacco: Never Used  . Alcohol use No    Review of Systems  Constitutional: No fever/chills Eyes: No visual changes.  ENT: No sore throat. Cardiovascular: as above Respiratory: Denies shortness of breath. Gastrointestinal: No abdominal pain.  No nausea, no vomiting.   Genitourinary: Negative for dysuria. Musculoskeletal: Negative for back pain. Skin: Negative for rash. Neurological: Negative for headaches   ____________________________________________   PHYSICAL EXAM:  VITAL SIGNS: ED Triage Vitals  Enc Vitals Group     BP 01/14/17 0940 (!) 204/81     Pulse Rate 01/14/17 0940 64     Resp 01/14/17 0940 14     Temp 01/14/17 0940 97.9 F (36.6 C)     Temp Source 01/14/17 0940 Oral     SpO2 01/14/17 0940 96 %     Weight 01/14/17 0940 198 lb (89.8 kg)     Height 01/14/17 0940  (  1.753 m)     Head Circumference --      Peak Flow --      Pain Score 01/14/17 0947 5     Pain Loc --      Pain Edu? --      Excl. in GC? --     Constitutional: Alert and oriented. No acute distress. Pleasant and interactive Eyes: Conjunctivae are normal.  Head: Atraumatic. Nose: No congestion/rhinnorhea. Mouth/Throat: Mucous membranes are moist.    Cardiovascular: Normal rate, regular rhythm. Grossly normal heart sounds.  Good peripheral circulation. Respiratory: Normal respiratory effort.  No  retractions. Lungs CTAB. Gastrointestinal: Soft and nontender. No distention.  No CVA tenderness. Genitourinary: deferred Musculoskeletal: No lower extremity tenderness nor edema.  Warm and well perfused Neurologic:  Normal speech and language. No gross focal neurologic deficits are appreciated.  Skin:  Skin is warm, dry and intact. No rash noted. Psychiatric: Mood and affect are normal. Speech and behavior are normal.  ____________________________________________   LABS (all labs ordered are listed, but only abnormal results are displayed)  Labs Reviewed  BASIC METABOLIC PANEL - Abnormal; Notable for the following:       Result Value   Glucose, Bld 128 (*)    All other components within normal limits  CBC - Abnormal; Notable for the following:    WBC 11.2 (*)    RDW 15.8 (*)    All other components within normal limits  TROPONIN I - Abnormal; Notable for the following:    Troponin I 2.62 (*)    All other components within normal limits  APTT  PROTIME-INR   ____________________________________________  EKG  ED ECG REPORT I, Jene Every, the attending physician, personally viewed and interpreted this ECG.  Date: 01/14/2017 EKG Time: 9:45 AM Rate: 61 Rhythm: normal sinus rhythm QRS Axis: normal Intervals: normal ST/T Wave abnormalities: Nonspecific changes Conduction Disturbances: none   ____________________________________________  RADIOLOGY   ____________________________________________   PROCEDURES  Procedure(s) performed: No    Critical Care performed: yes  CRITICAL CARE Performed by: Jene Every   Total critical care time:  Critical care time was exclusive of separately billable procedures and treating other patients.  Critical care was necessary to treat or prevent imminent or life-threatening deterioration.  Critical care was time spent personally by me on the following activities: development of treatment plan with patient  and/or surrogate as well as nursing, discussions with consultants, evaluation of patient's response to treatment, examination of patient, obtaining history from patient or surrogate, ordering and performing treatments and interventions, ordering and review of laboratory studies, ordering and review of radiographic studies, pulse oximetry and re-evaluation of patient's condition. ____________________________________________   INITIAL IMPRESSION / ASSESSMENT AND PLAN / ED COURSE  Pertinent labs & imaging results that were available during my care of the patient were reviewed by me and considered in my medical decision making (see chart for details).  Patient presents with concerning waxing and waning chest pain. EKG is unremarkable however significantly elevated troponin. Discussed with Dr. Lady Gary of cardiology, we will start heparin gtt, give ASA. Also will give dose of IV labetalol for BP management.    ____________________________________________   FINAL CLINICAL IMPRESSION(S) / ED DIAGNOSES  Final diagnoses:  NSTEMI (non-ST elevated myocardial infarction) (HCC)      NEW MEDICATIONS STARTED DURING THIS VISIT:  New Prescriptions   No medications on file     Note:  This document was prepared using Dragon voice recognition software and may include unintentional dictation errors.  Jene Every, MD 01/14/17 [phone]

## 2017-01-14 NOTE — Progress Notes (Signed)
ANTICOAGULATION CONSULT NOTE - Initial Consult  Pharmacy Consult for Heparin Drip Indication: chest pain/ACS  Allergies  Allergen Reactions  . Sulfa Antibiotics Other (See Comments)    Reaction: Unknown    Patient Measurements: Height:  (175.3 cm) Weight: 198 lb (89.8 kg) IBW/kg (Calculated) : 70.7 Heparin Dosing Weight: 88.8 kg  Vital Signs: Temp: 97.9 F (36.6 C) (04/30 0940) Temp Source: Oral (04/30 0940) BP: 155/82 (04/30 1300) Pulse Rate: 54 (04/30 1300)  Labs:  Recent Labs  01/14/17 0945 01/14/17 1117  HGB 15.1  --   HCT 46.2  --   PLT 266  --   APTT  --  41*  LABPROT  --  13.7  INR  --  1.05  CREATININE 0.89  --   TROPONINI 2.62*  --     Estimated Creatinine Clearance: 59.9 mL/min (by C-G formula based on SCr of 0.89 mg/dL).   Medical History: Past Medical History:  Diagnosis Date  . cva 2014, 10/30   Right-sided facial droop, and right extremities weakness.  . CVA (cerebrovascular accident due to intracerebral hemorrhage) (HCC) 2010  . GERD (gastroesophageal reflux disease)   . Hypertension   . Hypothyroidism     Medications:  Scheduled:   Infusions:  . heparin 1,150 Units/hr (01/14/17 1211)    Assessment: Pharmacy consulted to order and monitor Heparin drip for this 83 y.o. male who presents with chest pain.   Goal of Therapy:  Heparin level 0.3-0.7 units/ml Monitor platelets by anticoagulation protocol: Yes   Plan:  Give 4000 units bolus x 1 Start heparin infusion at 1150 units/hr Check anti-Xa level in 8 hours and daily while on heparin Continue to monitor H&H and platelets  HL ordered for tonight at 20:00.  Stormy Card, [facility] Clinical Pharmacist 01/14/2017,1:19 PM

## 2017-01-14 NOTE — Progress Notes (Signed)
Advanced Care Plan.  Purpose of Encounter: Code status. Parties in Attendance: the patient, his wife and daughter, and me. Patient's Decisional Capacity: Yes. Medical Story: Ryan Cantu  is a 83 y.o. male with a known history of CVA, hypertension, hypothyroidism and GERD. He is being admitted for NSTEMI. I discussed with code status with the patient and family. He wants DNR and family agrees.  Plan:  Code Status: DNR.  Time spent discussing advance care planning: 17 minutes.

## 2017-01-14 NOTE — ED Notes (Addendum)
Troponin 2.62, reported to charge nurse.

## 2017-01-14 NOTE — ED Notes (Signed)
Pt in xray

## 2017-01-15 ENCOUNTER — Inpatient Hospital Stay
Admit: 2017-01-15 | Discharge: 2017-01-15 | Disposition: A | Discharge status: Home / Self Care | Payer: Medicare Other | Attending: Cardiology | Admitting: Cardiology

## 2017-01-15 DIAGNOSIS — Z7189 Other specified counseling: Secondary | ICD-10-CM

## 2017-01-15 DIAGNOSIS — Z515 Encounter for palliative care: Secondary | ICD-10-CM

## 2017-01-15 LAB — LIPID PANEL
CHOL/HDL RATIO: 5.6 ratio
CHOLESTEROL: 222 mg/dL — AB (ref 0–200)
HDL: 40 mg/dL — AB (ref >40)
LDL Cholesterol: 156 mg/dL — ABNORMAL HIGH (ref 0–99)
TRIGLYCERIDES: 128 mg/dL (ref <150)
VLDL: 26 mg/dL (ref 0–40)

## 2017-01-15 LAB — HEMOGLOBIN A1C
Hgb A1c MFr Bld: 5.8 % — ABNORMAL HIGH (ref 4.8–5.6)
MEAN PLASMA GLUCOSE: 120 mg/dL

## 2017-01-15 LAB — HEPARIN LEVEL (UNFRACTIONATED): Heparin Unfractionated: 0.51 IU/mL (ref 0.30–0.70)

## 2017-01-15 LAB — ECHOCARDIOGRAM COMPLETE
HEIGHTINCHES: 69 in
WEIGHTICAEL: 3168 [oz_av]

## 2017-01-15 MED ORDER — HEPARIN SODIUM (PORCINE) 5000 UNIT/ML IJ SOLN
5000.0000 [IU] | Freq: Three times a day (TID) | INTRAMUSCULAR | Status: DC
  Administered 2017-01-15 – 2017-01-16 (×2): 5000 [IU] via SUBCUTANEOUS
  Filled 2017-01-15 (×2): qty 1

## 2017-01-15 NOTE — Progress Notes (Signed)
[facility]  SUBJECTIVE: No further chest pain. Hemodynamically stable.   Vitals:   01/14/17 1418 01/14/17 1921 01/15/17 0411 01/15/17 0748  BP: (!) 188/87 135/67 128/70 (!) 133/59  Pulse: (!) 56 (!) 59 (!) 57 (!) 53  Resp: Temp: 97.4 F (36.3 C) 98 F (36.7 C) 98.3 F (36.8 C) 98.4 F (36.9 C)  TempSrc: Oral Oral Oral Oral  SpO2: 100% 94% 95% 93%  Weight:      Height:        Intake/Output Summary (Last 24 hours) at 01/15/17 1208 Last data filed at 01/15/17 1139  Gross per 24 hour  Intake           1977.4 ml  Output             1050 ml  Net            927.4 ml    LABS: Basic Metabolic Panel:  Recent Labs  32/44/01 0945  NA 137  K 3.8  CL 101  CO2 28  GLUCOSE 128*  BUN 14  CREATININE 0.89  CALCIUM 9.1   Liver Function Tests: No results for input(s): AST, ALT, ALKPHOS, BILITOT, PROT, ALBUMIN in the last 72 hours. No results for input(s): LIPASE, AMYLASE in the last 72 hours. CBC:  Recent Labs  01/14/17 0945  WBC 11.2*  HGB 15.1  HCT 46.2  MCV 86.0  PLT 266   Cardiac Enzymes:  Recent Labs  01/14/17 0945 01/14/17 1338 01/14/17 1955  TROPONINI 2.62* 11.24* 28.41*   BNP: Invalid input(s): POCBNP D-Dimer: No results for input(s): DDIMER in the last 72 hours. Hemoglobin A1C:  Recent Labs  01/14/17 0945  HGBA1C 5.8*   Fasting Lipid Panel:  Recent Labs  01/15/17 0403  CHOL 222*  HDL 40*  LDLCALC 156*  TRIG 128  CHOLHDL 5.6   Thyroid Function Tests: No results for input(s): TSH, T4TOTAL, T3FREE, THYROIDAB in the last 72 hours.  Invalid input(s): FREET3 Anemia Panel: No results for input(s): VITAMINB12, FOLATE, FERRITIN, TIBC, IRON, RETICCTPCT in the last 72 hours.   Physical Exam: Blood pressure (!) 133/59, pulse (!) 53, temperature 98.4 F (36.9 C), temperature source Oral, resp. rate 17, height  (1.753 m), weight 89.8 kg (198 lb), SpO2 93 %.   Wt Readings  from Last 1 Encounters:  01/14/17 89.8 kg (198 lb)     General appearance: cooperative Resp: clear to auscultation bilaterally Chest wall: no tenderness Cardio: regular rate and rhythm GI: soft, non-tender; bowel sounds normal; no masses,  no organomegaly Extremities: extremities normal, atraumatic, no cyanosis or edema Neurologic: Grossly normal  TELEMETRY: Reviewed telemetry pt in normal sinus rhythm:  ASSESSMENT AND PLAN:  Active Problems:   NSTEMI (non-ST elevated myocardial infarction) (HCC)-patient in for non-ST elevation myocardial infarction. Echo showed preserved LV function. No high-grade valvular disease. Patient is hemodynamically stable and asymptomatic at present. Given his comorbid conditions and risk for invasive evaluation, would recommend continued medical therapy to include aspirin, low-dose isosorbide mononitrate 30 mg daily, low-dose beta blocker with metoprolol tartrate at 25 mg twice daily. Atorvastatin at 40 mg daily. Given patient's age, would not recommend phase II cardiac rehabilitation. Would consider discharge later today or in the morning if stable. We'll be happy to follow as an outpatient if needed and desired. Would agree with palliative care. Discussed with patient and family.    Dalia Heading, MD, [facility] 01/15/2017 12:08 PM

## 2017-01-15 NOTE — Progress Notes (Signed)
Back from echocardiogram 

## 2017-01-15 NOTE — Plan of Care (Signed)
Problem: Safety: Goal: Ability to remain free from injury will improve Outcome: Progressing No fall this stay, fall precautions in place, non skid socks when oob  Problem: Pain Managment: Goal: General experience of comfort will improve Outcome: Progressing No complaints of pain this shift, prn medications  Problem: Activity: Goal: Risk for activity intolerance will decrease Outcome: Progressing Remains on Heparin gtt at 12u/hr  Problem: Activity: Goal: Ability to tolerate increased activity will improve Outcome: Not Progressing PT/OT evaluation ordered  Problem: Cardiac: Goal: Ability to achieve and maintain adequate cardiovascular perfusion will improve Outcome: Progressing Echocardiogram ordered for today

## 2017-01-15 NOTE — Evaluation (Signed)
Physical Therapy Evaluation Patient Details Name: Ryan Cantu MRN: [id number] DOB: 04/07/1924 Today's Date: 01/15/2017   History of Present Illness  83 y.o. male with a known history of CVA, hypertension, hypothyroidism and GERD. The patient presentd to ED with chest pain and elevated BP.  Admitted with NSTEMI.  Clinical Impression  Pt did well with PT exam and showed good safety and confidence with nearly all activities. He was able to walk ~200 ft, negotiated steps and had stable vitals t/o the session.  He reported being close to his baseline (friend also reports he is nearly back to baseline). He should be able to return home with the baseline assist that he currently has and would benefit from bout of HHPT.     Follow Up Recommendations Home health PT    Equipment Recommendations  None recommended by PT    Recommendations for Other Services       Precautions / Restrictions Precautions Precautions: Fall Restrictions Weight Bearing Restrictions: No      Mobility  Bed Mobility Overal bed mobility: Independent             General bed mobility comments: Pt able to easily get himself to EOB without assist  Transfers Overall transfer level: Independent Equipment used: Rolling walker (2 wheeled)             General transfer comment: Pt able to rise to standing with relative ease, did not heavily rely on walker to maintain balnace.   Ambulation/Gait Ambulation/Gait assistance: Supervision Ambulation Distance (Feet): 200 Feet Assistive device: Rolling walker (2 wheeled)       General Gait Details: Pt with confident and consistent ambulation with continuous forward walker momentum.  He had only very minimal fatigue (O2 remained in the high 90s, HR was 70s and BP was 120s/60s.  Stairs Stairs: Yes Stairs assistance: Supervision Stair Management: One rail Left Number of Stairs: 6 General stair comments: Pt was confident on steps and was even able to manage his  walker while negotiating steps.  Wheelchair Mobility    Modified Rankin (Stroke Patients Only)       Balance Overall balance assessment: Modified Independent                                           Pertinent Vitals/Pain Pain Assessment: No/denies pain    Home Living Family/patient expects to be discharged to:: Private residence Living Arrangements: Alone Available Help at Discharge: Friend(s);Family (friend generally stops by x1 AM, x1 PM)   Home Access: Stairs to enter Entrance Stairs-Rails: Left Entrance Stairs-Number of Steps: 3 Home Layout: Two level;Able to live on main level with bedroom/bathroom Home Equipment: Walker - 2 wheels      Prior Function Level of Independence: Independent with assistive device(s)         Comments: pt is able to go out to eat, run errands (he does not drive), generally able to be active     Hand Dominance        Extremity/Trunk Assessment   Upper Extremity Assessment Upper Extremity Assessment: Generalized weakness;Overall WFL for tasks assessed (age appropriate limitations)    Lower Extremity Assessment Lower Extremity Assessment: Overall WFL for tasks assessed;Generalized weakness (age appropriate limitations)       Communication   Communication: HOH  Cognition Arousal/Alertness: Awake/alert Behavior During Therapy: WFL for tasks assessed/performed Overall Cognitive Status: Within Functional Limits for tasks  assessed                                        General Comments      Exercises     Assessment/Plan    PT Assessment Patient needs continued PT services  PT Problem List Decreased strength;Decreased range of motion;Decreased activity tolerance;Decreased balance;Decreased mobility;Decreased coordination;Decreased knowledge of use of DME;Decreased safety awareness;Cardiopulmonary status limiting activity       PT Treatment Interventions DME instruction;Gait  training;Stair training;Functional mobility training;Balance training;Therapeutic activities;Therapeutic exercise;Neuromuscular re-education;Patient/family education    PT Goals (Current goals can be found in the Care Plan section)  Acute Rehab PT Goals Patient Stated Goal: go home PT Goal Formulation: With patient/family Time For Goal Achievement: 01/29/17 Potential to Achieve Goals: Fair    Frequency Min 2X/week   Barriers to discharge        Co-evaluation               AM-PAC PT "6 Clicks" Daily Activity  Outcome Measure Difficulty turning over in bed (including adjusting bedclothes, sheets and blankets)?: None Difficulty moving from lying on back to sitting on the side of the bed? : None Difficulty sitting down on and standing up from a chair with arms (e.g., wheelchair, bedside commode, etc,.)?: None Help needed moving to and from a bed to chair (including a wheelchair)?: None Help needed walking in hospital room?: None Help needed climbing 3-5 steps with a railing? : A Little 6 Click Score: 23    End of Session Equipment Utilized During Treatment: Gait belt Activity Tolerance: Patient tolerated treatment well Patient left: with call bell/phone within reach;with chair alarm set;with family/visitor present   PT Visit Diagnosis: Difficulty in walking, not elsewhere classified (R26.2);Muscle weakness (generalized) (M62.81)    Time: 1610-9604 PT Time Calculation (min) (ACUTE ONLY): 23 min   Charges:   PT Evaluation $PT Eval Low Complexity: 1 Procedure     PT G Codes:        [name], DPT 01/15/2017, 3:28 PM

## 2017-01-15 NOTE — Plan of Care (Signed)
Problem: Pain Managment: Goal: General experience of comfort will improve Outcome: Progressing Not complaints of pain this shift. Will continue to monitor.  Problem: Tissue Perfusion: Goal: Risk factors for ineffective tissue perfusion will decrease Outcome: Completed/Met Date Met: 01/15/17 Heparin gtt infusing @ 11.57m/hr.   Problem: Activity: Goal: Risk for activity intolerance will decrease Outcome: Progressing Pt up to side of bed to use urinal with standby assist, tolerated well.

## 2017-01-15 NOTE — Progress Notes (Signed)
Family Meeting Note  Advance Directive:no  Today a meeting took place with the Patient.  The following clinical team members were present during this meeting:MD and friend  The following were discussed:Patient's diagnosis: , Patient's progosis: < 12 months and Goals for treatment: DNR  Additional follow-up to be provided: Palliative care consultation  Time spent during discussion:20 minutes  Delfino Lovett, MD

## 2017-01-15 NOTE — Progress Notes (Addendum)
   [facility] [place] [facility]   PATIENT NAME: Ryan Cantu    MR#:  [id number]  DATE OF BIRTH:  1924-02-25  SUBJECTIVE:  CHIEF COMPLAINT:   Chief Complaint  Patient presents with  . Chest Pain  Not having any symptoms REVIEW OF SYSTEMS:  Review of Systems  Constitutional: Negative for chills, fever and weight loss.  HENT: Negative for nosebleeds and sore throat.   Eyes: Negative for blurred vision.  Respiratory: Negative for cough, shortness of breath and wheezing.   Cardiovascular: Negative for chest pain, orthopnea, leg swelling and PND.  Gastrointestinal: Negative for abdominal pain, constipation, diarrhea, heartburn, nausea and vomiting.  Genitourinary: Negative for dysuria and urgency.  Musculoskeletal: Negative for back pain.  Skin: Negative for rash.  Neurological: Negative for dizziness, speech change, focal weakness and headaches.  Endo/Heme/Allergies: Does not bruise/bleed easily.  Psychiatric/Behavioral: Negative for depression.    DRUG ALLERGIES:   Allergies  Allergen Reactions  . Sulfa Antibiotics Other (See Comments)    Reaction: Unknown   VITALS:  Blood pressure (!) 133/59, pulse (!) 53, temperature 98.4 F (36.9 C), temperature source Oral, resp. rate 17, height  (1.753 m), weight 89.8 kg (198 lb), SpO2 93 %. PHYSICAL EXAMINATION:  Physical Exam LABORATORY PANEL:  Male CBC  Recent Labs Lab 01/14/17 0945  WBC 11.2*  HGB 15.1  HCT 46.2  PLT 266   ------------------------------------------------------------------------------------------------------------------ Chemistries   Recent Labs Lab 01/14/17 0945  NA 137  K 3.8  CL 101  CO2 28  GLUCOSE 128*  BUN 14  CREATININE 0.89  CALCIUM 9.1   RADIOLOGY:  Dg Chest 2 View  Result Date: 01/14/2017 CLINICAL DATA:  Chest pain EXAM: CHEST  2 VIEW COMPARISON:  05/19/2015 FINDINGS: Heart size mildly enlarged. Negative for heart failure. Mild atelectasis in the lung  bases. Negative for pneumonia or effusion. IMPRESSION: Mild bibasilar atelectasis. Electronically Signed   By: Marlan Palau M.D.   On: 01/14/2017 11:22   ASSESSMENT AND PLAN:  83 year old male with history of hypertension and a previous stroke who presents with approximate 24-36 hour history of chest pain And is ruled in for MI  * NSTEMI - Continue ASA, heparin drip, lopressor, lipitor - Cardiology considering conservative management, considering her advanced age and dementia - Echo shows normal LV systolic function and no major wall motion abnormality  * Hypertension urgency. - Resolved - Continue imdur, metoprolol and as needed hydralazine for better blood pressure control  * History of CVA: Continue aspirin and statin  * Hypothyroidism. Continue thyroxine.    All the records are reviewed and case discussed with Care Management/Social Worker. Management plans discussed with the patient, his friend, Dr. Lady Gary and they are in agreement.  CODE STATUS: DNR, consult palliative care  TOTAL TIME TAKING CARE OF THIS PATIENT: 25 minutes.   More than 50% of the time was spent in counseling/coordination of care: YES  POSSIBLE D/C IN 2-3 DAYS, DEPENDING ON CLINICAL CONDITION.   Delfino Lovett M.D on 01/15/2017 at 9:19 AM  Between 7am to 6pm - Pager - [phone]  After 6pm go to [email or web address] - Social research officer, government  [facility]  Office  [phone]  CC: Primary care physician; Danella Penton, MD  Note: This dictation was prepared with Dragon dictation along with smaller phrase technology. Any transcriptional errors that result from this process are unintentional.

## 2017-01-15 NOTE — Progress Notes (Signed)
To cardiovascular for echo via bed

## 2017-01-15 NOTE — Progress Notes (Signed)
*  PRELIMINARY RESULTS* Echocardiogram 2D Echocardiogram has been performed.  Cristela Blue 01/15/2017, 8:14 AM

## 2017-01-15 NOTE — Consult Note (Signed)
Consultation Note Date: 01/15/2017   Patient Name: Ryan Cantu  DOB: 08/19/1924  MRN: [id number]  Age / Sex: 83 y.o., male  PCP: Rusty Aus, MD Referring Physician: Max Sane, MD  Reason for Consultation: Establishing goals of care  HPI/Patient Profile: 83 y.o. male  with past medical history of CVA, HTN, Hypthyroidism, GERD  admitted on 01/14/2017 with chest pain. Workup revealed elevated troponin, and hypertensive urgency. This is his second admission for hypertensive urgency- last in 08/2016. He was started on heparin drip. Cardiac cath was deferred. Palliative medicine consulted for [name].     Clinical Assessment and Goals of Care: Met with patient and his friend Proofreader.   Patient lives at home alone. He is extremely HOH, but participates somewhat in conversation. Helga lives close by and visits him regularly. Patient cooks his own breakfast. Is able to complete his own ADL's. Receives deliveries from meals [employer] wheels. He has a daughter who is his HCPOA.   Helga notes he has declined in functional status over the last year- he occasionally stumbles while walking with his walker. He hasn't had any falls, but he tells me if the wall wasn't there to catch him he would have fallen.   Patient feels he has very good quality of life at home. PT eval completed and recommended that patient is able to return home. I observed him walking well, with a walker, in the hall without difficulty. [name] is concerned about him being alone at night. Patient's current GOC is to remain at home, independent as possible for as long as possible. He understands DNR code status, and would not want any surgical or invasive procedures that might decrease his functional status.   He does not complain of pain, depression, or insomnia. He reports his appetite is good.   Spoke with patient's HCPOA. She is in agreement with patient's stated  GOC. Given patient's history of CVA, recent NSTEMI and hypertensive state, and pattern of recent decline- would recommend community palliative follow up for continued GOC and to monitor patient's trajectory. I am concerned he will deteriorate rapidly.   Primary Decision Maker HCPOA - Lenis Noon    SUMMARY OF RECOMMENDATIONS -Outpatient Palliative F/U    Code Status/Advance Care Planning:  DNR  Additional Recommendations (Limitations, Scope, Preferences):  Avoid Hospitalization and No Surgical Procedures  Prognosis:    Unable to determine  Discharge Planning: Home with Palliative Services  Primary Diagnoses: Present on Admission: . NSTEMI (non-ST elevated myocardial infarction) ([place])   I have reviewed the medical record, interviewed the patient and family, and examined the patient. The following aspects are pertinent.  Past Medical History:  Diagnosis Date  . cva 2014, 10/30   Right-sided facial droop, and right extremities weakness.  . CVA (cerebrovascular accident due to intracerebral hemorrhage) ([place]) 2010  . GERD (gastroesophageal reflux disease)   . Hypertension   . Hypothyroidism    Social History   Social History  . Marital status: Widowed    Spouse name: widowed  . Number of  children: N/A  . Years of education: N/A   Occupational History  . Clinical research associate     Retired   Social History Main Topics  . Smoking status: Never Smoker  . Smokeless tobacco: Never Used  . Alcohol use No  . Drug use: No  . Sexual activity: Not Currently   Other Topics Concern  . None   Social History Narrative  . None   Family History  Problem Relation Age of Onset  . Cancer Mother   . Cancer - Other Father   . Melanoma Brother   . Colon cancer Brother    Scheduled Meds: . aspirin EC  81 mg Oral Daily  . atorvastatin  40 mg Oral q1800  . heparin subcutaneous  5,000 Units Subcutaneous Q8H  . isosorbide mononitrate  30 mg Oral Daily  . levothyroxine  100 mcg  Oral QAC breakfast  . metoprolol tartrate  25 mg Oral BID  . pantoprazole  40 mg Oral Daily   Continuous Infusions: PRN Meds:.acetaminophen, hydrALAZINE, nitroGLYCERIN, ondansetron (ZOFRAN) IV Medications Prior to Admission:  Prior to Admission medications   Medication Sig Start Date End Date Taking? Authorizing Provider  levothyroxine (SYNTHROID, LEVOTHROID) 100 MCG tablet Take 100 mcg by mouth daily before breakfast.   Yes Historical Provider, MD  magnesium oxide (MAG-OX) 400 MG tablet Take 400 mg by mouth daily.   Yes Historical Provider, MD  Multiple Vitamins-Minerals (PRESERVISION AREDS PO) Take 1 tablet by mouth daily.   Yes Historical Provider, MD  omeprazole (PRILOSEC) 20 MG capsule Take 20 mg by mouth daily.    Yes Historical Provider, MD  amLODipine (NORVASC) 10 MG tablet Take 1 tablet (10 mg total) by mouth daily. Patient not taking: Reported on 01/14/2017 09/10/16   Fritzi Mandes, MD   Allergies  Allergen Reactions  . Sulfa Antibiotics Other (See Comments)    Reaction: Unknown   Review of Systems  Physical Exam  Constitutional: He is oriented to person, place, and time. He appears well-developed and well-nourished.  HENT:  HOH  Cardiovascular: Normal rate and regular rhythm.   Pulmonary/Chest: Effort normal.  Neurological: He is alert and oriented to person, place, and time.  Skin: Skin is warm and dry.  Psychiatric:  Pleasant  Nursing note and vitals reviewed.   Vital Signs: BP 126/66 (BP Location: Left Arm)   Pulse (!) 59   Temp 98.2 F (36.8 C) (Oral)   Resp 17   Ht 5' 9"  (1.753 m)   Wt 89.8 kg (198 lb)   SpO2 95%   BMI 29.24 kg/m  Pain Assessment: No/denies pain   Pain Score: 0-No pain   SpO2: SpO2: 95 % O2 Device:SpO2: 95 % O2 Flow Rate: .   IO: Intake/output summary:  Intake/Output Summary (Last 24 hours) at 01/15/17 1623 Last data filed at 01/15/17 1415  Gross per 24 hour  Intake          2477.26 ml  Output             1150 ml  Net           1327.26 ml    LBM: Last BM Date: 01/15/17 Baseline Weight: Weight: 89.8 kg (198 lb) Most recent weight: Weight: 89.8 kg (198 lb)     Palliative Assessment/Data: PPS: 60%   Flowsheet Rows     Most Recent Value  Intake Tab  Referral Department  Hospitalist  Unit at Time of Referral  Cardiac/Telemetry Unit  Palliative Care Primary Diagnosis  Cardiac  Date Notified  01/15/17  Palliative Care Type  New Palliative care  Reason for referral  Clarify Goals of Care  Date of Admission  01/14/17  # of days IP prior to Palliative referral  1  Clinical Assessment  Psychosocial & Spiritual Assessment  Palliative Care Outcomes      Thank you for this consult. Palliative medicine will continue to follow and assist as needed.   Time In: 1530 Time Out: 1620 Time Total: 50 minutes Greater than 50%  of this time was spent counseling and coordinating care related to the above assessment and plan.  Signed by: Mariana Kaufman, AGNP-C Palliative Medicine    Please contact Palliative Medicine Team phone at [phone] for questions and concerns.  For individual provider: See Shea Evans

## 2017-01-16 DIAGNOSIS — Z7189 Other specified counseling: Secondary | ICD-10-CM

## 2017-01-16 DIAGNOSIS — Z515 Encounter for palliative care: Secondary | ICD-10-CM

## 2017-01-16 LAB — BASIC METABOLIC PANEL
ANION GAP: 6 (ref 5–15)
BUN: 11 mg/dL (ref 6–20)
CHLORIDE: 105 mmol/L (ref 101–111)
CO2: 27 mmol/L (ref 22–32)
Calcium: 8.4 mg/dL — ABNORMAL LOW (ref 8.9–10.3)
Creatinine, Ser: 0.71 mg/dL (ref 0.61–1.24)
GFR calc Af Amer: >60 mL/min (ref >60)
GLUCOSE: 98 mg/dL (ref 65–99)
POTASSIUM: 3.7 mmol/L (ref 3.5–5.1)
Sodium: 138 mmol/L (ref 135–145)

## 2017-01-16 LAB — CBC
HEMATOCRIT: 41 % (ref 40.0–52.0)
HEMOGLOBIN: 13.4 g/dL (ref 13.0–18.0)
MCH: 28.1 pg (ref 26.0–34.0)
MCHC: 32.6 g/dL (ref 32.0–36.0)
MCV: 86.3 fL (ref 80.0–100.0)
Platelets: 205 10*3/uL (ref 150–440)
RBC: 4.75 MIL/uL (ref 4.40–5.90)
RDW: 15.7 % — ABNORMAL HIGH (ref 11.5–14.5)
WBC: 8.3 10*3/uL (ref 3.8–10.6)

## 2017-01-16 MED ORDER — METOPROLOL TARTRATE 25 MG PO TABS: 12.5000 mg | ORAL_TABLET | Freq: Every day | ORAL | 0 refills | Status: DC

## 2017-01-16 MED ORDER — ATORVASTATIN CALCIUM 40 MG PO TABS: 40.0000 mg | ORAL_TABLET | Freq: Every day | ORAL | 0 refills | Status: DC

## 2017-01-16 MED ORDER — ISOSORBIDE MONONITRATE ER 30 MG PO TB24: 30.0000 mg | ORAL_TABLET | Freq: Every day | ORAL | 0 refills | Status: DC

## 2017-01-16 MED ORDER — ASPIRIN 81 MG PO TBEC: 81.0000 mg | DELAYED_RELEASE_TABLET | Freq: Every day | ORAL | 0 refills | Status: AC

## 2017-01-16 NOTE — Care Management Important Message (Signed)
Important Message  Patient Details  Name: Sparsh Callens MRN: [id number] Date of Birth: [birth year]   Medicare Important Message Given:  Yes Signed IM notice given   Eber Hong, RN 01/16/2017, 12:03 PM

## 2017-01-16 NOTE — Progress Notes (Signed)
Patient is alert and oriented, vss, no complaints of pain.  D/c telemetry and  D/c PIV.  No questions at this time.  Patient to be escorted out of hospital via wheelchair by volunteers.

## 2017-01-16 NOTE — Evaluation (Signed)
Occupational Therapy Evaluation Patient Details Name: Ryan Cantu MRN: [id number] DOB: 17-Jun-1924 Today's Date: 01/16/2017    History of Present Illness 83 y.o. male with a known history of CVA, hypertension, hypothyroidism and GERD. The patient presentd to ED with chest pain and elevated BP.  Admitted with NSTEMI.   Clinical Impression   Pt is 83 year old male who presents to [facility] hospital with NSTEMI.  He lives at home alone and his daughter Erie Noe is present for evaluation.  He has had low vision for a while which his daughter thinks is from macular degeneration and has everything in a specific place and functions well with this set up.  He has generalized weakness and needs cues to use FWW for ambulation and has a transfer tub bench at home but refuses to use it.  He has grab bars in his bathroom by the side of the tub which he does use.  He is able to complete most of his ADLs with supervision but needs assist for LLE since he cannot cross his Left foot over R. Initiated education about energy conservation since he is SOB during ADLs but only mildly.  Pt is supposed to go home today with [facility] and rec addition of OT HH.  Sarah from [facility] was present and rec OT HH.   .       Follow Up Recommendations  Home health OT    Equipment Recommendations  Tub/shower bench    Recommendations for Other Services       Precautions / Restrictions Precautions Precautions: Fall Restrictions Weight Bearing Restrictions: No      Mobility Bed Mobility                  Transfers                      Balance                                           ADL either performed or assessed with clinical judgement   ADL Overall ADL's : Needs assistance/impaired Eating/Feeding: Independent;Set up   Grooming: Wash/dry hands;Wash/dry face;Applying deodorant;Brushing hair;Oral care;Independent;Set up           Upper Body Dressing : Independent;Set up   Lower  Body Dressing: Set up;Minimal assistance Lower Body Dressing Details (indicate cue type and reason): difficulty crossing L leg over R to put sock on and needed min assist and cues             Functional mobility during ADLs: Supervision/safety General ADL Comments: Pt's daughter present for eval and stated he has everything in a specific place at home and is how he functions so well despite his low vision.  Rec services from [facility] [employer] through [place] [employer] but they declined.  He has a large telephone with numbers and names of people he calls and has LifeLine that he wears at all times.        Vision   Additional Comments: pt has decreased vision most likely from macular degeneration per his daughter's report and there has been no change since admission.     Perception     Praxis      Pertinent Vitals/Pain Pain Assessment: No/denies pain     Hand Dominance Right   Extremity/Trunk Assessment Upper Extremity Assessment Upper Extremity Assessment: Generalized weakness;Overall [facility] for  tasks assessed   Lower Extremity Assessment Lower Extremity Assessment: Defer to PT evaluation       Communication Communication Communication: HOH   Cognition Arousal/Alertness: Awake/alert Behavior During Therapy: WFL for tasks assessed/performed Overall Cognitive Status: Within Functional Limits for tasks assessed                                     General Comments       Exercises     Shoulder Instructions      Home Living Family/patient expects to be discharged to:: Private residence Living Arrangements: Alone Available Help at Discharge: Friend(s);Family Type of Home: House Home Access: Stairs to enter [employer] of Steps: 3 Entrance Stairs-Rails: Left Home Layout: Two level;Able to live on main level with bedroom/bathroom Alternate Level Stairs-Number of Steps: flight Alternate Level Stairs-Rails: Can reach both Bathroom Shower/Tub:  Tub/shower unit;Curtain   Bathroom Toilet: Standard Bathroom Accessibility: Yes   Home Equipment: Tub bench;Walker - 2 wheels          Prior Functioning/Environment Level of Independence: Independent with assistive device(s)        Comments: pt is able to go out to eat, run errands (he does not drive and his girlfriend or daughter takes him), generally able to be active        OT Problem List: Decreased strength;Impaired vision/perception;Decreased safety awareness;Impaired balance (sitting and/or standing);Decreased activity tolerance      OT Treatment/Interventions: Self-care/ADL training;Patient/family education;Therapeutic activities;Energy conservation;Balance training    OT Goals(Current goals can be found in the care plan section) Acute Rehab OT Goals Patient Stated Goal: go home OT Goal Formulation: With patient/family Time For Goal Achievement: 01/30/17 Potential to Achieve Goals: Good ADL Goals Pt Will Perform Lower Body Dressing: with set-up;with supervision;sit to/from stand (sitting in chair with FW for mobility) Pt Will Transfer to Toilet: with set-up;with supervision;regular height toilet (using FWW and no LOB)  OT Frequency: Min 1X/week   Barriers to D/C:            Co-evaluation              AM-PAC PT "6 Clicks" Daily Activity     Outcome Measure Help from another person eating meals?: None Help from another person taking care of personal grooming?: None Help from another person toileting, which includes using toliet, bedpan, or urinal?: A Little Help from another person bathing (including washing, rinsing, drying)?: A Little Help from another person to put on and taking off regular upper body clothing?: None Help from another person to put on and taking off regular lower body clothing?: A Little 6 Click Score: 21   End of Session Nurse Communication:  (Spoke to Amy about IV line site bleeding and was up in chair, dressed and ready to go home  and no alarm was on.)  Activity Tolerance: Patient tolerated treatment well Patient left: in chair;with call bell/phone within reach;with family/visitor present  OT Visit Diagnosis: Unsteadiness on feet (R26.81);Muscle weakness (generalized) (M62.81);Low vision, both eyes (H54.2)                Time: 1400-1439 OT Time Calculation (min): 39 min Charges:  OT General Charges $OT Visit: 1 Procedure OT Evaluation $OT Eval Low Complexity: 1 Procedure OT Treatments $Self Care/Home Management : 23-37 mins G-Codes:     Susanne Borders, OTR/L ascom [phone] 01/16/17, 3:03 PM

## 2017-01-16 NOTE — Discharge Instructions (Signed)
Heart Attack A heart attack (myocardial infarction, MI) causes damage to the heart that cannot be fixed. A heart attack often happens when a blood clot or other blockage cuts blood flow to the heart. When this happens, certain areas of the heart begin to die. This causes the pain you feel during a heart attack. Follow these instructions at home:  Take medicine as told by your doctor. You may need medicine to: ? Keep your blood from clotting too easily. ? Control your blood pressure. ? Lower your cholesterol. ? Control abnormal heart rhythms.  Change certain behaviors as told by your doctor. This may include: ? Quitting smoking. ? Being active. ? Eating a heart-healthy diet. Ask your doctor for help with this diet. ? Keeping a healthy weight. ? Keeping your diabetes under control. ? Lessening stress. ? Limiting how much alcohol you drink. Do not take these medicines unless your doctor says that you can:  Nonsteroidal anti-inflammatory drugs (NSAIDs). These include: ? Ibuprofen. ? Naproxen. ? Celecoxib.  Vitamin supplements that have vitamin A, vitamin E, or both.  Hormone therapy that contains estrogen with or without progestin.  Get help right away if:  You have sudden chest discomfort.  You have sudden discomfort in your: ? Arms. ? Back. ? Neck. ? Jaw.  You have shortness of breath at any time.  You have sudden sweating or clammy skin.  You feel sick to your stomach (nauseous) or throw up (vomit).  You suddenly get light-headed or dizzy.  You feel your heart beating fast or skipping beats. These symptoms may be an emergency. Do not wait to see if the symptoms will go away. Get medical help right away. Call your local emergency services (911 in the [place] Do not drive yourself to the hospital. This information is not intended to replace advice given to you by your health care provider. Make sure you discuss any questions you have with your health care  provider. Document Released: 03/04/2012 Document Revised: 02/09/2016 Document Reviewed: 11/06/2013 Elsevier Interactive Patient Education  2017 Elsevier Inc.  

## 2017-01-16 NOTE — Care Management (Signed)
Admitted with nstemi.  Presents from home.  Has a caregiver that visits twice a day.  Agreeable to have home health nurse and physical therapy and outpatient palliative care.  Referral to  [facility] who is in network with patient's insurance.

## 2017-01-16 NOTE — Progress Notes (Signed)
New referral for Home Palliative to follow after discharge received from [facility] Ermalene Searing. Plan is for discharge today. Patient information faxed to referral. Thank you. Dayna Barker RN, BSN, [facility] [place], [facility] liaison [phone] c

## 2017-01-16 NOTE — Plan of Care (Signed)
Problem: Pain Managment: Goal: General experience of comfort will improve Outcome: Completed/Met Date Met: 01/16/17 No complaints of pain this shift. Will continue to monitor.  Problem: Bowel/Gastric: Goal: Will not experience complications related to bowel motility Outcome: Completed/Met Date Met: 01/16/17 Pt had BM 01/15/17

## 2017-01-17 NOTE — Discharge Summary (Signed)
5        [facility] [place] at [facility]   PATIENT NAME: Ryan Cantu    MR#:  [id number]  DATE OF BIRTH:  [birth year]  DATE OF ADMISSION:  01/14/2017   ADMITTING PHYSICIAN: Shaune Pollack, MD  DATE OF DISCHARGE: 01/16/2017  3:24 PM  PRIMARY CARE PHYSICIAN: Danella Penton, MD   ADMISSION DIAGNOSIS:  NSTEMI (non-ST elevated myocardial infarction) (HCC) [I21.4] DISCHARGE DIAGNOSIS:  Active Problems:   NSTEMI (non-ST elevated myocardial infarction) [facility])   Palliative care by specialist   Advance care planning  SECONDARY DIAGNOSIS:   Past Medical History:  Diagnosis Date  . cva 2014, 10/30   Right-sided facial droop, and right extremities weakness.  . CVA (cerebrovascular accident due to intracerebral hemorrhage) (HCC) 2010  . GERD (gastroesophageal reflux disease)   . Hypertension   . Hypothyroidism    HOSPITAL COURSE:  83 year old male with history of hypertension and a previous stroke admitted with approximate 24-36 hour history of chest pain And is ruled in for MI  * NSTEMI: Patient is asymptomatic - Continue ASA, Imdur, Lopressor, lipitor on discharge - Cardiology considering conservative management, considering her advanced age and dementia and family is in agreement. - Echo shows normal LV systolic function and no major wall motion abnormality  * Hypertension urgency. - Resolved - Continue imdur, metoprolol for better blood pressure control  * History of CVA: Continue aspirin and statin  * Hypothyroidism. Continue thyroxine.  DISCHARGE CONDITIONS:  Stable CONSULTS OBTAINED:  Treatment Team:  Dalia Heading, MD DRUG ALLERGIES:   Allergies  Allergen Reactions  . Sulfa Antibiotics Other (See Comments)    Reaction: Unknown   DISCHARGE MEDICATIONS:   Allergies as of 01/16/2017      Reactions   Sulfa Antibiotics Other (See Comments)   Reaction: Unknown      Medication List    STOP taking these medications   amLODipine 10 MG  tablet Commonly known as:  NORVASC     TAKE these medications   aspirin 81 MG EC tablet Take 1 tablet (81 mg total) by mouth daily.   atorvastatin 40 MG tablet Commonly known as:  LIPITOR Take 1 tablet (40 mg total) by mouth daily at 6 PM.   isosorbide mononitrate 30 MG 24 hr tablet Commonly known as:  IMDUR Take 1 tablet (30 mg total) by mouth daily.   levothyroxine 100 MCG tablet Commonly known as:  SYNTHROID, LEVOTHROID Take 100 mcg by mouth daily before breakfast.   magnesium oxide 400 MG tablet Commonly known as:  MAG-OX Take 400 mg by mouth daily.   metoprolol tartrate 25 MG tablet Commonly known as:  LOPRESSOR Take 0.5 tablets (12.5 mg total) by mouth daily.   omeprazole 20 MG capsule Commonly known as:  PRILOSEC Take 20 mg by mouth daily.   PRESERVISION AREDS PO Take 1 tablet by mouth daily.        DISCHARGE INSTRUCTIONS:   DIET:  Cardiac diet DISCHARGE CONDITION:  Stable ACTIVITY:  Activity as tolerated OXYGEN:  Home Oxygen: No.  Oxygen Delivery: room air DISCHARGE LOCATION:  home with palliative care service evaluation while at home  If you experience worsening of your admission symptoms, develop shortness of breath, life threatening emergency, suicidal or homicidal thoughts you must seek medical attention immediately by calling 911 or calling your MD immediately  if symptoms less severe.  You Must read complete instructions/literature along with all the possible adverse reactions/side effects for all the Medicines you  take and that have been prescribed to you. Take any new Medicines after you have completely understood and accpet all the possible adverse reactions/side effects.   Please note  You were cared for by a hospitalist during your hospital stay. If you have any questions about your discharge medications or the care you received while you were in the hospital after you are discharged, you can call the unit and asked to speak with the  hospitalist on call if the hospitalist that took care of you is not available. Once you are discharged, your primary care physician will handle any further medical issues. Please note that NO REFILLS for any discharge medications will be authorized once you are discharged, as it is imperative that you return to your primary care physician (or establish a relationship with a primary care physician if you do not have one) for your aftercare needs so that they can reassess your need for medications and monitor your lab values.    On the day of Discharge:  VITAL SIGNS:  Blood pressure (!) 146/82, pulse (!) 57, temperature 97.7 F (36.5 C), temperature source Oral, resp. rate 19, height 5\' 9"  (1.753 m), weight 89.8 kg (198 lb), SpO2 93 %. PHYSICAL EXAMINATION:  GENERAL:  83 y.o.-year-old patient lying in the bed with no acute distress.  EYES: Pupils equal, round, reactive to light and accommodation. No scleral icterus. Extraocular muscles intact.  HEENT: Head atraumatic, normocephalic. Oropharynx and nasopharynx clear.  NECK:  Supple, no jugular venous distention. No thyroid enlargement, no tenderness.  LUNGS: Normal breath sounds bilaterally, no wheezing, rales,rhonchi or crepitation. No use of accessory muscles of respiration.  CARDIOVASCULAR: S1, S2 normal. No murmurs, rubs, or gallops.  ABDOMEN: Soft, non-tender, non-distended. Bowel sounds present. No organomegaly or mass.  EXTREMITIES: No pedal edema, cyanosis, or clubbing.  NEUROLOGIC: Cranial nerves II through XII are intact. Muscle strength 5/5 in all extremities. Sensation intact. Gait not checked.  PSYCHIATRIC: The patient is alert and oriented x 3.  SKIN: No obvious rash, lesion, or ulcer.  DATA REVIEW:   CBC  Recent Labs Lab 01/16/17 0610  WBC 8.3  HGB 13.4  HCT 41.0  PLT 205    Chemistries   Recent Labs Lab 01/16/17 0610  NA 138  K 3.7  CL 105  CO2 27  GLUCOSE 98  BUN 11  CREATININE 0.71  CALCIUM 8.4*     Follow-up Information    [facility] Follow up.   Specialty:  Home Health Services Why:  Nurse and physical therapy Contact information: [address] [place] [address] [phone]        Palliative Consult Follow up.   Contact information: [phone]       Danella PentonMark F Miller, MD. Go on 01/23/2017.   Specialty:  Internal Medicine Why:  2:30pm  Contact information: [address] [facility] [place] [address] [phone]           Management plans discussed with the patient, family and they are in agreement.  CODE STATUS: Prior   TOTAL TIME TAKING CARE OF THIS PATIENT: 45 minutes.    Delfino LovettVipul Michaeline Eckersley M.D on 01/17/2017 at 9:31 AM  Between 7am to 6pm - Pager - [phone]  After 6pm go to [email or web address] - Social research officer, governmentpassword [email or web address]  [facility] [place] [facility]  Office  [phone]  CC: Primary care physician; Danella PentonMark F Miller, MD   Note: This dictation was prepared with Dragon dictation along with smaller phrase technology.  Any transcriptional errors that result from this process are unintentional.

## 2017-01-29 ENCOUNTER — Emergency Department: Payer: Medicare Other

## 2017-01-29 ENCOUNTER — Encounter: Payer: Self-pay | Admitting: Emergency Medicine

## 2017-01-29 ENCOUNTER — Inpatient Hospital Stay
Admission: EM | Admit: 2017-01-29 | Discharge: 2017-02-02 | DRG: 281 | Disposition: A | Discharge status: Home / Self Care | Payer: Medicare Other | Attending: Internal Medicine | Admitting: Internal Medicine

## 2017-01-29 DIAGNOSIS — I10 Essential (primary) hypertension: Secondary | ICD-10-CM | POA: Diagnosis present

## 2017-01-29 DIAGNOSIS — I252 Old myocardial infarction: Secondary | ICD-10-CM

## 2017-01-29 DIAGNOSIS — Z515 Encounter for palliative care: Secondary | ICD-10-CM | POA: Diagnosis present

## 2017-01-29 DIAGNOSIS — K219 Gastro-esophageal reflux disease without esophagitis: Secondary | ICD-10-CM | POA: Diagnosis present

## 2017-01-29 DIAGNOSIS — I200 Unstable angina: Secondary | ICD-10-CM | POA: Diagnosis not present

## 2017-01-29 DIAGNOSIS — I222 Subsequent non-ST elevation (NSTEMI) myocardial infarction: Principal | ICD-10-CM | POA: Diagnosis present

## 2017-01-29 DIAGNOSIS — I69351 Hemiplegia and hemiparesis following cerebral infarction affecting right dominant side: Secondary | ICD-10-CM

## 2017-01-29 DIAGNOSIS — I214 Non-ST elevation (NSTEMI) myocardial infarction: Secondary | ICD-10-CM | POA: Diagnosis present

## 2017-01-29 DIAGNOSIS — Z882 Allergy status to sulfonamides status: Secondary | ICD-10-CM

## 2017-01-29 DIAGNOSIS — R079 Chest pain, unspecified: Secondary | ICD-10-CM

## 2017-01-29 DIAGNOSIS — R0902 Hypoxemia: Secondary | ICD-10-CM | POA: Diagnosis present

## 2017-01-29 DIAGNOSIS — E782 Mixed hyperlipidemia: Secondary | ICD-10-CM | POA: Diagnosis present

## 2017-01-29 DIAGNOSIS — I69392 Facial weakness following cerebral infarction: Secondary | ICD-10-CM

## 2017-01-29 DIAGNOSIS — Z79899 Other long term (current) drug therapy: Secondary | ICD-10-CM

## 2017-01-29 DIAGNOSIS — Z7189 Other specified counseling: Secondary | ICD-10-CM

## 2017-01-29 DIAGNOSIS — E039 Hypothyroidism, unspecified: Secondary | ICD-10-CM | POA: Diagnosis present

## 2017-01-29 DIAGNOSIS — Z7982 Long term (current) use of aspirin: Secondary | ICD-10-CM

## 2017-01-29 DIAGNOSIS — I25110 Atherosclerotic heart disease of native coronary artery with unstable angina pectoris: Secondary | ICD-10-CM | POA: Diagnosis present

## 2017-01-29 DIAGNOSIS — Z66 Do not resuscitate: Secondary | ICD-10-CM | POA: Diagnosis present

## 2017-01-29 HISTORY — DX: Acute myocardial infarction, unspecified: I21.9

## 2017-01-29 LAB — CBC
HCT: 45.8 % (ref 40.0–52.0)
Hemoglobin: 15.1 g/dL (ref 13.0–18.0)
MCH: 28.2 pg (ref 26.0–34.0)
MCHC: 32.9 g/dL (ref 32.0–36.0)
MCV: 85.8 fL (ref 80.0–100.0)
PLATELETS: 259 10*3/uL (ref 150–440)
RBC: 5.34 MIL/uL (ref 4.40–5.90)
RDW: 16.1 % — AB (ref 11.5–14.5)
WBC: 9.9 10*3/uL (ref 3.8–10.6)

## 2017-01-29 NOTE — ED Triage Notes (Signed)
Patient to ER from home via [facility] for c/o midsternal chest pain. Patient reports being hospitalized two weeks ago for the same with MI diagnosis. Patient's BP upon EMS arrival was 220/110. Patient was given 2 SL nitro and 324 ASA. Patient was unable to give pain score to EMS or upon arrival to ER, but states pain "has subsided quite a bit". Patient also reports vomiting "half a gallon" prior to EMS arrival. Patient states pain tonight feels same as pain prior to last hospital admission. Patient hypoxic upon arrival with 88% sat on RA. Patient does not appear short of breath or have any c/o such.

## 2017-01-29 NOTE — ED Notes (Signed)
Patient placed on 2L O2 upon arrival to ER d/t sat of 88% on RA.

## 2017-01-29 NOTE — ED Notes (Signed)
Patient transported to X-ray 

## 2017-01-30 ENCOUNTER — Encounter: Payer: Self-pay | Admitting: Internal Medicine

## 2017-01-30 DIAGNOSIS — R0902 Hypoxemia: Secondary | ICD-10-CM | POA: Diagnosis present

## 2017-01-30 DIAGNOSIS — I200 Unstable angina: Secondary | ICD-10-CM | POA: Diagnosis present

## 2017-01-30 DIAGNOSIS — Z66 Do not resuscitate: Secondary | ICD-10-CM | POA: Diagnosis present

## 2017-01-30 DIAGNOSIS — K219 Gastro-esophageal reflux disease without esophagitis: Secondary | ICD-10-CM | POA: Diagnosis present

## 2017-01-30 DIAGNOSIS — I214 Non-ST elevation (NSTEMI) myocardial infarction: Secondary | ICD-10-CM | POA: Diagnosis present

## 2017-01-30 DIAGNOSIS — R079 Chest pain, unspecified: Secondary | ICD-10-CM | POA: Diagnosis not present

## 2017-01-30 DIAGNOSIS — Z7189 Other specified counseling: Secondary | ICD-10-CM | POA: Diagnosis not present

## 2017-01-30 DIAGNOSIS — Z7982 Long term (current) use of aspirin: Secondary | ICD-10-CM | POA: Diagnosis not present

## 2017-01-30 DIAGNOSIS — E782 Mixed hyperlipidemia: Secondary | ICD-10-CM | POA: Diagnosis present

## 2017-01-30 DIAGNOSIS — E039 Hypothyroidism, unspecified: Secondary | ICD-10-CM | POA: Diagnosis present

## 2017-01-30 DIAGNOSIS — Z79899 Other long term (current) drug therapy: Secondary | ICD-10-CM | POA: Diagnosis not present

## 2017-01-30 DIAGNOSIS — Z882 Allergy status to sulfonamides status: Secondary | ICD-10-CM | POA: Diagnosis not present

## 2017-01-30 DIAGNOSIS — I69392 Facial weakness following cerebral infarction: Secondary | ICD-10-CM | POA: Diagnosis not present

## 2017-01-30 DIAGNOSIS — I69351 Hemiplegia and hemiparesis following cerebral infarction affecting right dominant side: Secondary | ICD-10-CM | POA: Diagnosis not present

## 2017-01-30 DIAGNOSIS — I25110 Atherosclerotic heart disease of native coronary artery with unstable angina pectoris: Secondary | ICD-10-CM | POA: Diagnosis present

## 2017-01-30 DIAGNOSIS — I222 Subsequent non-ST elevation (NSTEMI) myocardial infarction: Secondary | ICD-10-CM | POA: Diagnosis present

## 2017-01-30 DIAGNOSIS — I252 Old myocardial infarction: Secondary | ICD-10-CM | POA: Diagnosis not present

## 2017-01-30 DIAGNOSIS — Z515 Encounter for palliative care: Secondary | ICD-10-CM | POA: Diagnosis present

## 2017-01-30 DIAGNOSIS — I10 Essential (primary) hypertension: Secondary | ICD-10-CM | POA: Diagnosis present

## 2017-01-30 LAB — BASIC METABOLIC PANEL
ANION GAP: 10 (ref 5–15)
ANION GAP: 10 (ref 5–15)
BUN: 11 mg/dL (ref 6–20)
BUN: 10 mg/dL (ref 6–20)
CHLORIDE: 101 mmol/L (ref 101–111)
CHLORIDE: 101 mmol/L (ref 101–111)
CO2: 25 mmol/L (ref 22–32)
CO2: 27 mmol/L (ref 22–32)
Calcium: 8.9 mg/dL (ref 8.9–10.3)
Calcium: 8.7 mg/dL — ABNORMAL LOW (ref 8.9–10.3)
Creatinine, Ser: 0.86 mg/dL (ref 0.61–1.24)
Creatinine, Ser: 0.74 mg/dL (ref 0.61–1.24)
GFR calc Af Amer: >60 mL/min (ref >60)
GFR calc non Af Amer: >60 mL/min (ref >60)
GFR calc non Af Amer: >60 mL/min (ref >60)
GLUCOSE: 130 mg/dL — AB (ref 65–99)
GLUCOSE: 87 mg/dL (ref 65–99)
POTASSIUM: 3.7 mmol/L (ref 3.5–5.1)
POTASSIUM: 3.6 mmol/L (ref 3.5–5.1)
Sodium: 136 mmol/L (ref 135–145)
Sodium: 138 mmol/L (ref 135–145)

## 2017-01-30 LAB — TROPONIN I
Troponin I: 0.13 ng/mL (ref <0.03)
Troponin I: 7.18 ng/mL (ref <0.03)
Troponin I: 6.03 ng/mL (ref <0.03)
Troponin I: 4.77 ng/mL (ref <0.03)

## 2017-01-30 LAB — HEPARIN LEVEL (UNFRACTIONATED): Heparin Unfractionated: 0.84 IU/mL — ABNORMAL HIGH (ref 0.30–0.70)

## 2017-01-30 LAB — APTT: aPTT: 43 seconds — ABNORMAL HIGH (ref 24–36)

## 2017-01-30 MED ORDER — ATORVASTATIN CALCIUM 20 MG PO TABS
40.0000 mg | ORAL_TABLET | Freq: Every day | ORAL | Status: DC
  Administered 2017-01-30 – 2017-02-01 (×2): 40 mg via ORAL
  Filled 2017-01-30 (×2): qty 2

## 2017-01-30 MED ORDER — METOPROLOL TARTRATE 25 MG PO TABS
12.5000 mg | ORAL_TABLET | Freq: Every day | ORAL | Status: DC
  Administered 2017-01-30 – 2017-02-02 (×4): 12.5 mg via ORAL
  Filled 2017-01-30 (×4): qty 1

## 2017-01-30 MED ORDER — ASPIRIN 81 MG PO CHEW: 324.0000 mg | CHEWABLE_TABLET | ORAL | Status: AC

## 2017-01-30 MED ORDER — ISOSORBIDE MONONITRATE ER 30 MG PO TB24
30.0000 mg | ORAL_TABLET | Freq: Every day | ORAL | Status: DC
  Administered 2017-01-30 – 2017-02-02 (×4): 30 mg via ORAL
  Filled 2017-01-30 (×4): qty 1

## 2017-01-30 MED ORDER — HEPARIN BOLUS VIA INFUSION
4000.0000 [IU] | Freq: Once | INTRAVENOUS | Status: AC
  Administered 2017-01-30: 4000 [IU] via INTRAVENOUS
  Filled 2017-01-30: qty 4000

## 2017-01-30 MED ORDER — SODIUM CHLORIDE 0.9 % IV SOLN
INTRAVENOUS | Status: DC
  Administered 2017-01-30 – 2017-02-02 (×6): via INTRAVENOUS

## 2017-01-30 MED ORDER — ASPIRIN EC 81 MG PO TBEC
81.0000 mg | DELAYED_RELEASE_TABLET | Freq: Every day | ORAL | Status: DC
  Administered 2017-01-30 – 2017-02-02 (×4): 81 mg via ORAL
  Filled 2017-01-30 (×4): qty 1

## 2017-01-30 MED ORDER — MAGNESIUM OXIDE 400 (241.3 MG) MG PO TABS
400.0000 mg | ORAL_TABLET | Freq: Every day | ORAL | Status: DC
  Administered 2017-01-30 – 2017-02-02 (×4): 400 mg via ORAL
  Filled 2017-01-30 (×4): qty 1

## 2017-01-30 MED ORDER — ONDANSETRON HCL 4 MG/2ML IJ SOLN: 4.0000 mg | Freq: Four times a day (QID) | INTRAMUSCULAR | Status: DC | PRN

## 2017-01-30 MED ORDER — NITROGLYCERIN 2 % TD OINT
1.0000 [in_us] | TOPICAL_OINTMENT | Freq: Once | TRANSDERMAL | Status: AC
  Administered 2017-01-30: 1 [in_us] via TOPICAL
  Filled 2017-01-30: qty 1

## 2017-01-30 MED ORDER — ASPIRIN EC 81 MG PO TBEC
81.0000 mg | DELAYED_RELEASE_TABLET | Freq: Every day | ORAL | Status: DC
Start: 2017-01-31 — End: 2017-01-30

## 2017-01-30 MED ORDER — PANTOPRAZOLE SODIUM 40 MG PO TBEC
40.0000 mg | DELAYED_RELEASE_TABLET | Freq: Every day | ORAL | Status: DC
  Administered 2017-01-30 – 2017-02-02 (×4): 40 mg via ORAL
  Filled 2017-01-30 (×4): qty 1

## 2017-01-30 MED ORDER — ASPIRIN 300 MG RE SUPP: 300.0000 mg | RECTAL | Status: AC

## 2017-01-30 MED ORDER — LEVOTHYROXINE SODIUM 100 MCG PO TABS
100.0000 ug | ORAL_TABLET | Freq: Every day | ORAL | Status: DC
  Administered 2017-01-30 – 2017-02-02 (×4): 100 ug via ORAL
  Filled 2017-01-30 (×4): qty 1

## 2017-01-30 MED ORDER — NITROGLYCERIN 0.4 MG SL SUBL: 0.4000 mg | SUBLINGUAL_TABLET | SUBLINGUAL | Status: DC | PRN

## 2017-01-30 MED ORDER — HEPARIN (PORCINE) IN NACL 100-0.45 UNIT/ML-% IJ SOLN
950.0000 [IU]/h | INTRAMUSCULAR | Status: DC
  Administered 2017-01-30: 950 [IU]/h via INTRAVENOUS
  Administered 2017-01-30: 1150 [IU]/h via INTRAVENOUS
  Administered 2017-01-31: 950 [IU]/h via INTRAVENOUS
  Filled 2017-01-30 (×3): qty 250

## 2017-01-30 MED ORDER — ACETAMINOPHEN 325 MG PO TABS: 650.0000 mg | ORAL_TABLET | ORAL | Status: DC | PRN

## 2017-01-30 MED ORDER — CLOPIDOGREL BISULFATE 75 MG PO TABS
75.0000 mg | ORAL_TABLET | Freq: Every day | ORAL | Status: DC
  Administered 2017-01-31 – 2017-02-02 (×3): 75 mg via ORAL
  Filled 2017-01-30 (×3): qty 1

## 2017-01-30 NOTE — H&P (Signed)
[facility] [place] at [facility]   PATIENT NAME: Ryan Cantu    MR#:  [id number]  DATE OF BIRTH:  02/03/1924  DATE OF ADMISSION:  01/29/2017  PRIMARY CARE PHYSICIAN: Danella PentonMiller, Mark F, MD   REQUESTING/REFERRING PHYSICIAN:   CHIEF COMPLAINT:   Chief Complaint  Patient presents with  . Chest Pain    HISTORY OF PRESENT ILLNESS: Ryan Cantu  is a 83 y.o. male with a known history of CVA, GERD, hypertension, hypothyroidism, myocardial infarction presented to the emergency room with chest pain since yesterday night. Pain is located in the left side of the chest was sharp in nature 6 out of 10 on a scale of 1-10. Patient was recently managed at our hospital for non-STEMI in the first week of May. [facility]. No complaints of any shortness of breath, palpitations. Patient was evaluated in the emergency room troponin was elevated. Hospitalist service was consulted for further care of the patient.  PAST MEDICAL HISTORY:   Past Medical History:  Diagnosis Date  . cva 2014, 10/30   Right-sided facial droop, and right extremities weakness.  . CVA (cerebrovascular accident due to intracerebral hemorrhage) (HCC) 2010  . GERD (gastroesophageal reflux disease)   . Hypertension   . Hypothyroidism   . MI (myocardial infarction) (HCC)     PAST SURGICAL HISTORY: Past Surgical History:  Procedure Laterality Date  . EYE SURGERY      SOCIAL HISTORY:  Social History  Substance Use Topics  . Smoking status: Never Smoker  . Smokeless tobacco: Never Used  . Alcohol use No    FAMILY HISTORY:  Family History  Problem Relation Age of Onset  . Cancer Mother   . Cancer - Other Father   . Melanoma Brother   . Colon cancer Brother     DRUG ALLERGIES:  Allergies  Allergen Reactions  . Sulfa Antibiotics Other (See Comments)    Reaction: Unknown    REVIEW OF SYSTEMS:   CONSTITUTIONAL: No fever, fatigue or weakness.  EYES: No blurred or  double vision.  EARS, NOSE, AND THROAT: No tinnitus or ear pain.  RESPIRATORY: No cough, shortness of breath, wheezing or hemoptysis.  CARDIOVASCULAR: Has chest pain, No orthopnea, edema.  GASTROINTESTINAL: No nausea, vomiting, diarrhea or abdominal pain.  GENITOURINARY: No dysuria, hematuria.  ENDOCRINE: No polyuria, nocturia,  HEMATOLOGY: No anemia, easy bruising or bleeding SKIN: No rash or lesion. MUSCULOSKELETAL: No joint pain or arthritis.   NEUROLOGIC: No tingling, numbness, weakness.  PSYCHIATRY: No anxiety or depression.   MEDICATIONS AT HOME:  Prior to Admission medications   Medication Sig Start Date End Date Taking? Authorizing Provider  aspirin EC 81 MG EC tablet Take 1 tablet (81 mg total) by mouth daily. 01/17/17  Yes Delfino LovettShah, Vipul, MD  atorvastatin (LIPITOR) 40 MG tablet Take 1 tablet (40 mg total) by mouth daily at 6 PM. 01/16/17  Yes Delfino LovettShah, Vipul, MD  isosorbide mononitrate (IMDUR) 30 MG 24 hr tablet Take 1 tablet (30 mg total) by mouth daily. 01/17/17  Yes Delfino LovettShah, Vipul, MD  levothyroxine (SYNTHROID, LEVOTHROID) 100 MCG tablet Take 100 mcg by mouth daily before breakfast.   Yes [provider]  magnesium oxide (MAG-OX) 400 MG tablet Take 400 mg by mouth daily.   Yes [provider]  metoprolol tartrate (LOPRESSOR) 25 MG tablet Take 0.5 tablets (12.5 mg total) by mouth daily. 01/16/17  Yes Delfino LovettShah, Vipul, MD  Multiple Vitamins-Minerals (PRESERVISION AREDS PO) Take 1 tablet by mouth  daily.   Yes [provider]  omeprazole (PRILOSEC) 20 MG capsule Take 20 mg by mouth daily.    Yes [provider]      PHYSICAL EXAMINATION:   VITAL SIGNS: Blood pressure (!) 164/87, pulse (!) 59, temperature 98.6 F (37 C), temperature source Oral, resp. rate 12, height 5\' 9"  (1.753 m), weight 89.8 kg (198 lb), SpO2 95 %.  GENERAL:  83 y.o.-year-old patient lying in the bed with no acute distress.  EYES: Pupils equal, round, reactive to light and accommodation. No  scleral icterus. Extraocular muscles intact.  HEENT: Head atraumatic, normocephalic. Oropharynx and nasopharynx clear.  NECK:  Supple, no jugular venous distention. No thyroid enlargement, no tenderness.  LUNGS: Normal breath sounds bilaterally, no wheezing, rales,rhonchi or crepitation. No use of accessory muscles of respiration.  CARDIOVASCULAR: S1, S2 normal. No murmurs, rubs, or gallops.  ABDOMEN: Soft, nontender, nondistended. Bowel sounds present. No organomegaly or mass.  EXTREMITIES: No pedal edema, cyanosis, or clubbing.  NEUROLOGIC: Cranial nerves II through XII are intact. Muscle strength 5/5 in all extremities. Sensation intact. Gait not checked.  PSYCHIATRIC: The patient is alert and oriented x 3.  SKIN: No obvious rash, lesion, or ulcer.   LABORATORY PANEL:   CBC  Recent Labs Lab 01/29/17 2324  WBC 9.9  HGB 15.1  HCT 45.8  PLT 259  MCV 85.8  MCH 28.2  MCHC 32.9  RDW 16.1*   ------------------------------------------------------------------------------------------------------------------  Chemistries   Recent Labs Lab 01/29/17 2324  NA 136  K 3.7  CL 101  CO2 25  GLUCOSE 130*  BUN 11  CREATININE 0.86  CALCIUM 8.9   ------------------------------------------------------------------------------------------------------------------ estimated creatinine clearance is 62 mL/min (by C-G formula based on SCr of 0.86 mg/dL). ------------------------------------------------------------------------------------------------------------------ No results for input(s): TSH, T4TOTAL, T3FREE, THYROIDAB in the last 72 hours.  Invalid input(s): FREET3   Coagulation profile No results for input(s): INR, PROTIME in the last 168 hours. ------------------------------------------------------------------------------------------------------------------- No results for input(s): DDIMER in the last 72  hours. -------------------------------------------------------------------------------------------------------------------  Cardiac Enzymes  Recent Labs Lab 01/29/17 2324  TROPONINI 0.13*   ------------------------------------------------------------------------------------------------------------------ Invalid input(s): POCBNP  ---------------------------------------------------------------------------------------------------------------  Urinalysis    Component Value Date/Time   COLORURINE YELLOW (A) 11/07/2015 0943   APPEARANCEUR CLEAR (A) 11/07/2015 0943   APPEARANCEUR Clear 07/16/2013 1428   LABSPEC 1.012 11/07/2015 0943   LABSPEC 1.012 07/16/2013 1428   PHURINE 6.0 11/07/2015 0943   GLUCOSEU NEGATIVE 11/07/2015 0943   GLUCOSEU Negative 07/16/2013 1428   HGBUR NEGATIVE 11/07/2015 0943   BILIRUBINUR NEGATIVE 11/07/2015 0943   BILIRUBINUR Negative 07/16/2013 1428   KETONESUR NEGATIVE 11/07/2015 0943   PROTEINUR NEGATIVE 11/07/2015 0943   UROBILINOGEN 0.2 05/10/2009 0719   NITRITE NEGATIVE 11/07/2015 0943   LEUKOCYTESUR NEGATIVE 11/07/2015 0943   LEUKOCYTESUR Negative 07/16/2013 1428     RADIOLOGY: Dg Chest 2 View  Result Date: 01/29/2017 CLINICAL DATA:  Midsternal chest pain. EXAM: CHEST  2 VIEW COMPARISON:  Radiographs 02/13/2017 FINDINGS: Unchanged heart size and mediastinal contours with tortuosity of the thoracic aorta and atherosclerosis. No pulmonary edema. Mild bibasilar atelectasis or scarring, left greater than right. No consolidation, pleural effusion or pneumothorax. Stable osseous structures. IMPRESSION: Aortic tortuosity atherosclerosis.  Mild bibasilar atelectasis. Electronically Signed   By: Rubye Oaks M.D.   On: 01/29/2017 23:57    EKG: Orders placed or performed during the hospital encounter of 01/29/17  . ED EKG within 10 minutes  . ED EKG within 10 minutes    IMPRESSION AND PLAN: 83 year old male patient with  history of CVA, myocardial  infarction, hypothyroidism, GERD, hypertension presented to the emergency room with chest pain. Admitting diagnosis 1. Non-ST elevation MI 2. Hypothyroidism 3. Hypertension 4. GERD Treatment plan Admit patient to telemetry observation bed Start patient on IV heparin drip Cardiology consultation Obtain old echocardiogram report Resume aspirin, statin medication along with nitrates Supportive care All the records are reviewed and case discussed with ED provider. Management plans discussed with the patient, family and they are in agreement.  CODE STATUS:DNR Surrogate decision maker : daughter Code Status History    Date Active Date Inactive Code Status Order ID Comments User Context   01/14/2017  2:17 PM 01/16/2017  6:29 PM DNR [id number]  Shaune Pollack, MD Inpatient   09/08/2016  3:20 PM 09/09/2016  5:29 PM Full Code [id number]  Hower, Cletis [place], MD ED   05/19/2015  2:56 AM 05/23/2015  8:14 PM Full Code [id number]  Crissie Figures, MD Inpatient    Questions for Most Recent Historical Code Status (Order [id number])    Question Answer Comment   In the event of cardiac or respiratory ARREST Do not call a "code blue"    In the event of cardiac or respiratory ARREST Do not perform Intubation, CPR, defibrillation or ACLS    In the event of cardiac or respiratory ARREST Use medication by any route, position, wound care, and other measures to relive pain and suffering. May use oxygen, suction and manual treatment of airway obstruction as needed for comfort.        TOTAL TIME TAKING CARE OF THIS PATIENT: 50 minutes.    Ihor Austin M.D on 01/30/2017 at 4:25 AM  Between 7am to 6pm - Pager - [phone]  After 6pm go to [email or web address] - password [email or web address] [facility]  [place] [facility]  Office  [phone]  CC: Primary care physician; Danella Penton, MD

## 2017-01-30 NOTE — Consult Note (Signed)
[facility] Cardiology Consultation Note  Patient ID: Ryan Cantu, MRN: [id number], DOB/AGE: [birth year] 81 y.o. Admit date: 01/29/2017   Date of Consult: 01/30/2017 Primary Physician: Danella Penton, MD Primary Cardiologist: None  Chief Complaint:  Chief Complaint  Patient presents with  . Chest Pain   Reason for Consult: acute non-ST elevation myocardial infarction  HPI: 83 y.o. male with known coronary artery disease peripheral vascular disease essential hypertension and mixed hyperlipidemia having a recent myocardial infarction with elevated troponin and treated medically. The patient was discharged home with appropriate medication management including metoprolol and isosorbide with high intensity cholesterol therapy and antiplatelet medication management. The patient was continuing to be short of breath weak and fatigue and was unable to continue with staying at home due to this severe shortness of breath and chest discomfort. Patient was seen back in the emergency room with significant hypertension and troponin level of 7.1 consistent with non-ST elevation myocardial infarction or extension of previous myocardial infarction with some pulmonary edema possibly consistent with acute heart failure. The patient was given some furosemide and has had improvements of hypoxia and nitroglycerin and currently has not had any further chest pain this evening the patient is hemodynamically stable but still has some mild hypoxia  Past Medical History:  Diagnosis Date  . cva 2014, 10/30   Right-sided facial droop, and right extremities weakness.  . CVA (cerebrovascular accident due to intracerebral hemorrhage) (HCC) 2010  . GERD (gastroesophageal reflux disease)   . Hypertension   . Hypothyroidism   . MI (myocardial infarction) [facility])       Surgical History:  Past Surgical History:  Procedure Laterality Date  . EYE SURGERY       Home Meds: Prior to Admission medications   Medication Sig  Start Date End Date Taking? Authorizing Provider  aspirin EC 81 MG EC tablet Take 1 tablet (81 mg total) by mouth daily. 01/17/17  Yes Delfino Lovett, MD  atorvastatin (LIPITOR) 40 MG tablet Take 1 tablet (40 mg total) by mouth daily at 6 PM. 01/16/17  Yes Delfino Lovett, MD  isosorbide mononitrate (IMDUR) 30 MG 24 hr tablet Take 1 tablet (30 mg total) by mouth daily. 01/17/17  Yes Delfino Lovett, MD  levothyroxine (SYNTHROID, LEVOTHROID) 100 MCG tablet Take 100 mcg by mouth daily before breakfast.   Yes [provider]  magnesium oxide (MAG-OX) 400 MG tablet Take 400 mg by mouth daily.   Yes [provider]  metoprolol tartrate (LOPRESSOR) 25 MG tablet Take 0.5 tablets (12.5 mg total) by mouth daily. 01/16/17  Yes Delfino Lovett, MD  Multiple Vitamins-Minerals (PRESERVISION AREDS PO) Take 1 tablet by mouth daily.   Yes [provider]  omeprazole (PRILOSEC) 20 MG capsule Take 20 mg by mouth daily.    Yes [provider]    Inpatient Medications:  . aspirin  324 mg Oral NOW   Or  . aspirin  300 mg Rectal NOW  . aspirin EC  81 mg Oral Daily  . atorvastatin  40 mg Oral q1800  . isosorbide mononitrate  30 mg Oral Daily  . levothyroxine  100 mcg Oral QAC breakfast  . magnesium oxide  400 mg Oral Daily  . metoprolol tartrate  12.5 mg Oral Daily  . pantoprazole  40 mg Oral Daily   . sodium chloride 75 mL/hr at 01/30/17 0652  . heparin 950 Units/hr (01/30/17 1731)    Allergies:  Allergies  Allergen Reactions  . Sulfa Antibiotics Other (See Comments)  Reaction: Unknown    Social History   Social History  . Marital status: Widowed    Spouse name: widowed  . Number of children: N/A  . Years of education: N/A   Occupational History  . Archivistcabinet maker     Retired   Social History Main Topics  . Smoking status: Never Smoker  . Smokeless tobacco: Never Used  . Alcohol use No  . Drug use: No  . Sexual activity: Not Currently   Other Topics Concern  . Not on file    Social History Narrative  . No narrative on file     Family History  Problem Relation Age of Onset  . Cancer Mother   . Cancer - Other Father   . Melanoma Brother   . Colon cancer Brother      Review of Systems Positive for Shortness of breath chest pain Negative for: General:  chills, fever, night sweats or weight changes.  Cardiovascular: PND orthopnea syncope dizziness  Dermatological skin lesions rashes Respiratory: Cough congestion Urologic: Frequent urination urination at night and hematuria Abdominal: negative for nausea, vomiting, diarrhea, bright red blood per rectum, melena, or hematemesis Neurologic: negative for visual changes, and/or hearing changes  All other systems reviewed and are otherwise negative except as noted above.  Labs:  Recent Labs  01/29/17 2324 01/30/17 0624 01/30/17 1227  TROPONINI 0.13* 7.18* 6.03*   Lab Results  Component Value Date   WBC 9.9 01/29/2017   HGB 15.1 01/29/2017   HCT 45.8 01/29/2017   MCV 85.8 01/29/2017   PLT 259 01/29/2017    Recent Labs Lab 01/30/17 0624  NA 138  K 3.6  CL 101  CO2 27  BUN 10  CREATININE 0.74  CALCIUM 8.7*  GLUCOSE 87   Lab Results  Component Value Date   CHOL 222 (H) 01/15/2017   HDL 40 (L) 01/15/2017   LDLCALC 156 (H) 01/15/2017   TRIG 128 01/15/2017   No results found for: DDIMER  Radiology/Studies:  Dg Chest 2 View  Result Date: 01/29/2017 CLINICAL DATA:  Midsternal chest pain. EXAM: CHEST  2 VIEW COMPARISON:  Radiographs 02/13/2017 FINDINGS: Unchanged heart size and mediastinal contours with tortuosity of the thoracic aorta and atherosclerosis. No pulmonary edema. Mild bibasilar atelectasis or scarring, left greater than right. No consolidation, pleural effusion or pneumothorax. Stable osseous structures. IMPRESSION: Aortic tortuosity atherosclerosis.  Mild bibasilar atelectasis. Electronically Signed   By: Rubye OaksMelanie  Ehinger M.D.   On: 01/29/2017 23:57   Dg Chest 2  View  Result Date: 01/14/2017 CLINICAL DATA:  Chest pain EXAM: CHEST  2 VIEW COMPARISON:  05/19/2015 FINDINGS: Heart size mildly enlarged. Negative for heart failure. Mild atelectasis in the lung bases. Negative for pneumonia or effusion. IMPRESSION: Mild bibasilar atelectasis. Electronically Signed   By: Marlan Palauharles  Clark M.D.   On: 01/14/2017 11:22    EKG: Normal sinus rhythm with nonspecific ST changes  Weights: Filed Weights   01/29/17 2325 01/30/17 0618  Weight: 89.8 kg (198 lb) 86.8 kg (191 lb 4.8 oz)     Physical Exam: Blood pressure 135/76, pulse (!) 51, temperature 97.5 F (36.4 C), temperature source Oral, resp. rate 14, height 5\' 9"  (1.753 m), weight 86.8 kg (191 lb 4.8 oz), SpO2 96 %. Body mass index is 28.25 kg/m. General: Well developed, well nourished, in no acute distress. Head eyes ears nose throat: Normocephalic, atraumatic, sclera non-icteric, no xanthomas, nares are without discharge. No apparent thyromegaly and/or mass  Lungs: Normal respiratory effort.  Few wheezes,  no rales, no rhonchi.  Heart: RRR with normal S1 S2. no murmur gallop, no rub, PMI is normal size and placement, carotid upstroke normal without bruit, jugular venous pressure is normal Abdomen: Soft, non-tender, non-distended with normoactive bowel sounds. No hepatomegaly. No rebound/guarding. No obvious abdominal masses. Abdominal aorta is normal size without bruit Extremities: Trace edema. no cyanosis, no clubbing, no ulcers  Peripheral : 2+ bilateral upper extremity pulses, 2+ bilateral femoral pulses, 2+ bilateral dorsal pedal pulse Neuro: Alert and oriented. No facial asymmetry. No focal deficit. Moves all extremities spontaneously. Musculoskeletal: Normal muscle tone with kyphosis Psych:  Responds to questions appropriately with a normal affect.    Assessment: 83 year old male with peripheral vascular and coronary artery disease status post recent non-ST elevation myocardial infarction on  appropriate medication with extension of myocardial infarction today and some mild heart failure  Plan: 1. Continue heparin for 24-48 hours for further risk reduction in extension of myocardial infarction 2. Consider dual antiplatelet therapy for coronary artery disease and myocardial infarction including aspirin with Plavix 75 mg 3. Continue nitrates orally for any chest pain and myocardial infarction 4. Metoprolol for myocardial infarction although I cannot increase dose at this time due to concerns of bradycardia 5. Further consideration of ACE inhibitor if able for hypertension control and risk reduction due to concerns of an unable to increase metoprolol 6. High intensity cholesterol therapy with atorvastatin 7. Begin ambulation and follow for further significant symptoms requiring adjustments of medications 8. No further invasive procedures and/or intervention unless necessary  Signed, Lamar Blinks M.D. [facility] Cardiology 01/30/2017, 6:06 PM

## 2017-01-30 NOTE — ED Notes (Signed)
Date and time results received: 01/30/17  1218   Test: Troponin Critical Value: 0.13  Name of Provider Notified: Dr. Dolores FrameSung  Orders Received? Or Actions Taken?: No new orders at this time.

## 2017-01-30 NOTE — Progress Notes (Signed)
ANTICOAGULATION CONSULT NOTE - Initial Consult  Pharmacy Consult for heparin drip Indication: chest pain/ACS  Allergies  Allergen Reactions  . Sulfa Antibiotics Other (See Comments)    Reaction: Unknown    Patient Measurements: Height: 5\' 9"  (175.3 cm) Weight: 191 lb 4.8 oz (86.8 kg) IBW/kg (Calculated) : 70.7 Heparin Dosing Weight: 87kg  Vital Signs: Temp: 97.5 F (36.4 C) (05/16 1222) Temp Source: Oral (05/16 1222) BP: 135/76 (05/16 1222) Pulse Rate: 51 (05/16 1222)  Labs:  Recent Labs  01/29/17 2324 01/30/17 0624 01/30/17 0712 01/30/17 1227 01/30/17 1631  HGB 15.1  --   --   --   --   HCT 45.8  --   --   --   --   PLT 259  --   --   --   --   APTT  --   --  43*  --   --   HEPARINUNFRC  --   --   --   --  0.84*  CREATININE 0.86 0.74  --   --   --   TROPONINI 0.13* 7.18*  --  6.03*  --     Estimated Creatinine Clearance: 65.6 mL/min (by C-G formula based on SCr of 0.74 mg/dL).   Medical History: Past Medical History:  Diagnosis Date  . cva 2014, 10/30   Right-sided facial droop, and right extremities weakness.  . CVA (cerebrovascular accident due to intracerebral hemorrhage) (HCC) 2010  . GERD (gastroesophageal reflux disease)   . Hypertension   . Hypothyroidism   . MI (myocardial infarction) (HCC)     Medications:  No anticoagulation in PTA meds  Assessment:  Goal of Therapy:  Heparin level 0.3-0.7 units/ml Monitor platelets by anticoagulation protocol: Yes   Plan:  4000 unit bolus and initial rate of 1150 units/hr. First heparin level 8 hours after start of infusion.  5/16 1631 HL 0.84- Level is supratherapeutic. Will decrease infusion to 950u/hr and recheck HL in 8 hours.    Gardner CandleSheema M Jatin Naumann, PharmD, BCPS Clinical Pharmacist 01/30/2017 5:16 PM

## 2017-01-30 NOTE — Progress Notes (Signed)
[employer] [facility] [place] [facility]   PATIENT NAME: Ryan Cantu    MR#:  [id number]  DATE OF BIRTH:  1924/02/07  SUBJECTIVE:  CHIEF COMPLAINT:   Chief Complaint  Patient presents with  . Chest Pain    Recently had NSTEMI, and cardiologist suggested conservative management due to old age. Came back again with chest pain, have high troponin, started on heparin drip, no pain now.  REVIEW OF SYSTEMS:  CONSTITUTIONAL: No fever, fatigue or weakness.  EYES: No blurred or double vision.  EARS, NOSE, AND THROAT: No tinnitus or ear pain.  RESPIRATORY: No cough, shortness of breath, wheezing or hemoptysis.  CARDIOVASCULAR: No chest pain, orthopnea, edema.  GASTROINTESTINAL: No nausea, vomiting, diarrhea or abdominal pain.  GENITOURINARY: No dysuria, hematuria.  ENDOCRINE: No polyuria, nocturia,  HEMATOLOGY: No anemia, easy bruising or bleeding SKIN: No rash or lesion. MUSCULOSKELETAL: No joint pain or arthritis.   NEUROLOGIC: No tingling, numbness, weakness.  PSYCHIATRY: No anxiety or depression.   ROS  DRUG ALLERGIES:   Allergies  Allergen Reactions  . Sulfa Antibiotics Other (See Comments)    Reaction: Unknown    VITALS:  Blood pressure 136/71, pulse 72, temperature 98.5 F (36.9 C), temperature source Oral, resp. rate 14, height 5\' 9"  (1.753 m), weight 86.8 kg (191 lb 4.8 oz), SpO2 94 %.  PHYSICAL EXAMINATION:  GENERAL:  83 y.o.-year-old patient lying in the bed with no acute distress.  EYES: Pupils equal, round, reactive to light and accommodation. No scleral icterus. Extraocular muscles intact.  HEENT: Head atraumatic, normocephalic. Oropharynx and nasopharynx clear.  NECK:  Supple, no jugular venous distention. No thyroid enlargement, no tenderness.  LUNGS: Normal breath sounds bilaterally, no wheezing, rales,rhonchi or crepitation. No use of accessory muscles of respiration.  CARDIOVASCULAR: S1, S2 normal. systolic murmurs .  ABDOMEN: Soft, nontender,  nondistended. Bowel sounds present. No organomegaly or mass.  EXTREMITIES: No pedal edema, cyanosis, or clubbing.  NEUROLOGIC: Cranial nerves II through XII are intact. Muscle strength 5/5 in all extremities. Sensation intact. Gait not checked.  PSYCHIATRIC: The patient is alert and oriented x 3.  SKIN: No obvious rash, lesion, or ulcer.   Physical Exam LABORATORY PANEL:   CBC  Recent Labs Lab 01/29/17 2324  WBC 9.9  HGB 15.1  HCT 45.8  PLT 259   ------------------------------------------------------------------------------------------------------------------  Chemistries   Recent Labs Lab 01/30/17 0624  NA 138  K 3.6  CL 101  CO2 27  GLUCOSE 87  BUN 10  CREATININE 0.74  CALCIUM 8.7*   ------------------------------------------------------------------------------------------------------------------  Cardiac Enzymes  Recent Labs Lab 01/30/17 1227 01/30/17 1808  TROPONINI 6.03* 4.77*   ------------------------------------------------------------------------------------------------------------------  RADIOLOGY:  Dg Chest 2 View  Result Date: 01/29/2017 CLINICAL DATA:  Midsternal chest pain. EXAM: CHEST  2 VIEW COMPARISON:  Radiographs 02/13/2017 FINDINGS: Unchanged heart size and mediastinal contours with tortuosity of the thoracic aorta and atherosclerosis. No pulmonary edema. Mild bibasilar atelectasis or scarring, left greater than right. No consolidation, pleural effusion or pneumothorax. Stable osseous structures. IMPRESSION: Aortic tortuosity atherosclerosis.  Mild bibasilar atelectasis. Electronically Signed   By: Rubye Oaks M.D.   On: 01/29/2017 23:57    ASSESSMENT AND PLAN:   Active Problems:   Non-STEMI (non-ST elevated myocardial infarction) (HCC)   * NSTEMI - Continue ASA, heparin drip, lopressor, lipitor - Cardiology considering conservative management, considering advanced age and dementia - Echo recently shows normal LV systolic  function and no major wall motion abnormality - suggested to add plavix and ACE inhibitor,  if BP permits on d/c.  * Hypertension  - Continue metoprolol  - May add ACE inhibitor on d/c.  * History of CVA: Continue aspirin and statin  * Hypothyroidism. Continue thyroxine.    All the records are reviewed and case discussed with Care Management/Social Workerr. Management plans discussed with the patient, family and they are in agreement.  CODE STATUS: DNR  TOTAL TIME TAKING CARE OF THIS PATIENT: 35 minutes.  Pt's daughter was present in room during my visit.   POSSIBLE D/C IN 1-2 DAYS, DEPENDING ON CLINICAL CONDITION.   Altamese DillingVACHHANI, Ritisha Deitrick M.D on 01/30/2017   Between 7am to 6pm - Pager - [phone]  After 6pm go to [email or web address] - password [email or web address]  [facility]  Office  [phone]  CC: Primary care physician; Danella PentonMiller, Mark F, MD  Note: This dictation was prepared with Dragon dictation along with smaller phrase technology. Any transcriptional errors that result from this process are unintentional.

## 2017-01-30 NOTE — Progress Notes (Signed)
Pt arrived via stretcher from ED with daughter at bedside. Pt A&O with no complaints of pain at this time. Telemetry monitor applied and called to CCMD.

## 2017-01-30 NOTE — Progress Notes (Signed)
ANTICOAGULATION CONSULT NOTE - Initial Consult  Pharmacy Consult for heparin drip Indication: chest pain/ACS  Allergies  Allergen Reactions  . Sulfa Antibiotics Other (See Comments)    Reaction: Unknown    Patient Measurements: Height: 5\' 9"  (175.3 cm) Weight: 191 lb 4.8 oz (86.8 kg) IBW/kg (Calculated) : 70.7 Heparin Dosing Weight: 87kg  Vital Signs: Temp: 97.8 F (36.6 C) (05/16 0618) Temp Source: Oral (05/15 2324) BP: 166/88 (05/16 0618) Pulse Rate: 55 (05/16 0618)  Labs:  Recent Labs  01/29/17 2324  HGB 15.1  HCT 45.8  PLT 259  CREATININE 0.86  TROPONINI 0.13*    Estimated Creatinine Clearance: 61 mL/min (by C-G formula based on SCr of 0.86 mg/dL).   Medical History: Past Medical History:  Diagnosis Date  . cva 2014, 10/30   Right-sided facial droop, and right extremities weakness.  . CVA (cerebrovascular accident due to intracerebral hemorrhage) (HCC) 2010  . GERD (gastroesophageal reflux disease)   . Hypertension   . Hypothyroidism   . MI (myocardial infarction) (HCC)     Medications:  No anticoagulation in PTA meds  Assessment:  Goal of Therapy:  Heparin level 0.3-0.7 units/ml Monitor platelets by anticoagulation protocol: Yes   Plan:  4000 unit bolus and initial rate of 1150 units/hr. First heparin level 8 hours after start of infusion.  Ryan Cantu S 01/30/2017,7:14 AM

## 2017-01-30 NOTE — Progress Notes (Signed)
Patient is becoming increasingly confused and impulsive. Patient believes there is a coffee pot in the room. Patient wants all lights dimmed/off in room. Unable to remove bed alarm light, patient is a high fall risk. Patient also request to have door closed. Nurse and Tech have increased rounding frequency.

## 2017-01-30 NOTE — ED Notes (Signed)
Attempted to take patient off of oxygen, patient's sats dropped to 92% while awake. Patient placed back on 2L O2. Denies any shortness of breath. States chest pain has completely resolved.

## 2017-01-30 NOTE — ED Provider Notes (Signed)
[facility] Emergency Department Provider Note   ____________________________________________   First MD Initiated Contact with Patient 01/30/17 0023     (approximate)  I have reviewed the triage vital signs and the nursing notes.   HISTORY  Chief Complaint Chest Pain    HPI Ryan Cantu is a 83 y.o. male brought to the ED via EMS from home with a chief complaint of chest pain. Patient was hospitalized 2 weeks ago with non-STEMI; on aspirin, Imdur, Lopressor and Lipitor. Cardiologyrecommended conservative medical management given his advanced age. Tonight patient complains of central chest pressure. BP per EMS was 220/110; he was given aspirin and 2 sublingual nitroglycerins en route with good relief of symptoms. Symptoms associated with nausea, vomiting and shortness of breath. Denies diaphoresis. Patient was hypoxic upon arrival to the ED with room air saturations of 88%. Denies recent fever, chills, cough, shortness of breath, abdominal pain. Denies recent travel or trauma.   Past Medical History:  Diagnosis Date  . cva 2014, 10/30   Right-sided facial droop, and right extremities weakness.  . CVA (cerebrovascular accident due to intracerebral hemorrhage) (HCC) 2010  . GERD (gastroesophageal reflux disease)   . Hypertension   . Hypothyroidism   . MI (myocardial infarction) [facility](HCC)     Patient Active Problem List   Diagnosis Date Noted  . Palliative care by specialist   . Advance care planning   . NSTEMI (non-ST elevated myocardial infarction) (HCC) 01/14/2017  . Acute posterior epistaxis 09/08/2016  . Weakness of right lower extremity 05/19/2015  . History of CVA (cerebrovascular accident) 05/19/2015  . Dizziness 07/30/2013  . GERD (gastroesophageal reflux disease) 07/30/2013  . CVA (cerebral vascular accident) (HCC) 07/23/2013  . Hypothyroidism 07/23/2013  . Essential hypertension 07/23/2013  . Late effects of CVA (cerebrovascular accident)  07/23/2013    Past Surgical History:  Procedure Laterality Date  . EYE SURGERY      Prior to Admission medications   Medication Sig Start Date End Date Taking? Authorizing Provider  aspirin EC 81 MG EC tablet Take 1 tablet (81 mg total) by mouth daily. 01/17/17   Delfino LovettShah, Vipul, MD  atorvastatin (LIPITOR) 40 MG tablet Take 1 tablet (40 mg total) by mouth daily at 6 PM. 01/16/17   Delfino LovettShah, Vipul, MD  isosorbide mononitrate (IMDUR) 30 MG 24 hr tablet Take 1 tablet (30 mg total) by mouth daily. 01/17/17   Delfino LovettShah, Vipul, MD  levothyroxine (SYNTHROID, LEVOTHROID) 100 MCG tablet Take 100 mcg by mouth daily before breakfast.    [provider]  magnesium oxide (MAG-OX) 400 MG tablet Take 400 mg by mouth daily.    [provider]  metoprolol tartrate (LOPRESSOR) 25 MG tablet Take 0.5 tablets (12.5 mg total) by mouth daily. 01/16/17   Delfino LovettShah, Vipul, MD  Multiple Vitamins-Minerals (PRESERVISION AREDS PO) Take 1 tablet by mouth daily.    [provider]  omeprazole (PRILOSEC) 20 MG capsule Take 20 mg by mouth daily.     [provider]    Allergies Sulfa antibiotics  Family History  Problem Relation Age of Onset  . Cancer Mother   . Cancer - Other Father   . Melanoma Brother   . Colon cancer Brother     Social History Social History  Substance Use Topics  . Smoking status: Never Smoker  . Smokeless tobacco: Never Used  . Alcohol use No    Review of Systems  Constitutional: No fever/chills. Eyes: No visual changes. ENT: No sore throat. Cardiovascular: Positive  for chest pain. Respiratory: Denies shortness of breath. Gastrointestinal: No abdominal pain.  Positive for nausea and vomiting.  No diarrhea.  No constipation. Genitourinary: Negative for dysuria. Musculoskeletal: Negative for back pain. Skin: Negative for rash. Neurological: Negative for headaches, focal weakness or numbness.   ____________________________________________   PHYSICAL EXAM:  VITAL  SIGNS: ED Triage Vitals  Enc Vitals Group     BP 01/29/17 2324 (!) 163/94     Pulse Rate 01/29/17 2324 90     Resp 01/29/17 2324 17     Temp 01/29/17 2324 98.6 F (37 C)     Temp Source 01/29/17 2324 Oral     SpO2 01/29/17 2324 (!) 88 %     Weight 01/29/17 2325 198 lb (89.8 kg)     Height 01/29/17 2325 5\' 9"  (1.753 m)     Head Circumference --      Peak Flow --      Pain Score --      Pain Loc --      Pain Edu? --      Excl. in GC? --     Constitutional: Alert and oriented. Well appearing and in no acute distress. Eyes: Conjunctivae are normal. PERRL. EOMI. Head: Atraumatic. Nose: No congestion/rhinnorhea. Mouth/Throat: Mucous membranes are moist.  Oropharynx non-erythematous. Neck: No stridor.   Cardiovascular: Normal rate, regular rhythm. Grossly normal heart sounds.  Good peripheral circulation. Respiratory: Normal respiratory effort.  No retractions. Lungs CTAB. Gastrointestinal: Soft and nontender. No distention. No abdominal bruits. No CVA tenderness. Musculoskeletal: No lower extremity tenderness nor edema.  No joint effusions. Neurologic:  Normal speech and language. No gross focal neurologic deficits are appreciated.  Skin:  Skin is warm, dry and intact. No rash noted. Psychiatric: Mood and affect are normal. Speech and behavior are normal.  ____________________________________________   LABS (all labs ordered are listed, but only abnormal results are displayed)  Labs Reviewed  BASIC METABOLIC PANEL - Abnormal; Notable for the following:       Result Value   Glucose, Bld 130 (*)    All other components within normal limits  CBC - Abnormal; Notable for the following:    RDW 16.1 (*)    All other components within normal limits  TROPONIN I - Abnormal; Notable for the following:    Troponin I 0.13 (*)    All other components within normal limits   ____________________________________________  EKG  ED ECG REPORT I, SUNG,JADE J, the attending physician,  personally viewed and interpreted this ECG.   Date: 01/30/2017  EKG Time: 2322  Rate: 91  Rhythm: normal EKG, normal sinus rhythm  Axis: Normal  Intervals:none  ST&T Change: Nonspecific  ____________________________________________  RADIOLOGY  Chest x-ray interpreted per Dr. Manus Gunning: Aortic tortuosity atherosclerosis. Mild bibasilar atelectasis. ____________________________________________   PROCEDURES  Procedure(s) performed: None  Procedures  Critical Care performed: No  ____________________________________________   INITIAL IMPRESSION / ASSESSMENT AND PLAN / ED COURSE  Pertinent labs & imaging results that were available during my care of the patient were reviewed by me and considered in my medical decision making (see chart for details).  83 year old male who is 2 weeks status post non-STEMI who presents with chest pain/unstable angina. He was given aspirin and 2 sublingual nitroglycerin prior to arrival with good relief of symptoms. Will apply nitroglycerin paste at this time. Troponin is 0.13; unclear if this is decreased from prior (peak troponin 28) although I would expect troponin to be normal 2 weeks status post cardiac event. Will  discuss with hospitalist to evaluate patient in the emergency department for admission.      ____________________________________________   FINAL CLINICAL IMPRESSION(S) / ED DIAGNOSES  Final diagnoses:  Unstable angina (HCC)  Nonspecific chest pain      NEW MEDICATIONS STARTED DURING THIS VISIT:  New Prescriptions   No medications on file     Note:  This document was prepared using Dragon voice recognition software and may include unintentional dictation errors.    Irean Hong, MD 01/30/17 [phone]

## 2017-01-31 LAB — CBC
HCT: 42.5 % (ref 40.0–52.0)
Hemoglobin: 14 g/dL (ref 13.0–18.0)
MCH: 28.1 pg (ref 26.0–34.0)
MCHC: 33 g/dL (ref 32.0–36.0)
MCV: 85.3 fL (ref 80.0–100.0)
Platelets: 232 10*3/uL (ref 150–440)
RBC: 4.98 MIL/uL (ref 4.40–5.90)
RDW: 16.1 % — AB (ref 11.5–14.5)
WBC: 8.3 10*3/uL (ref 3.8–10.6)

## 2017-01-31 LAB — HEPARIN LEVEL (UNFRACTIONATED)
HEPARIN UNFRACTIONATED: 0.45 [IU]/mL (ref 0.30–0.70)
HEPARIN UNFRACTIONATED: 0.59 [IU]/mL (ref 0.30–0.70)

## 2017-01-31 NOTE — Progress Notes (Signed)
[employer] [facility] [place] [facility]   PATIENT NAME: Ryan Cantu    MR#:  [id number]  DATE OF BIRTH:  March 16, 1924  SUBJECTIVE:  CHIEF COMPLAINT:   Chief Complaint  Patient presents with  . Chest Pain    Recently had NSTEMI, and cardiologist suggested conservative management due to old age. Came back again with chest pain, have high troponin, started on heparin drip, no pain now. Feels ok  REVIEW OF SYSTEMS:  CONSTITUTIONAL: No fever, fatigue or weakness.  EYES: No blurred or double vision.  EARS, NOSE, AND THROAT: No tinnitus or ear pain.  RESPIRATORY: No cough, shortness of breath, wheezing or hemoptysis.  CARDIOVASCULAR: No chest pain, orthopnea, edema.  GASTROINTESTINAL: No nausea, vomiting, diarrhea or abdominal pain.  GENITOURINARY: No dysuria, hematuria.  ENDOCRINE: No polyuria, nocturia,  HEMATOLOGY: No anemia, easy bruising or bleeding SKIN: No rash or lesion. MUSCULOSKELETAL: No joint pain or arthritis.   NEUROLOGIC: No tingling, numbness, weakness.  PSYCHIATRY: No anxiety or depression.   ROS  DRUG ALLERGIES:   Allergies  Allergen Reactions  . Sulfa Antibiotics Other (See Comments)    Reaction: Unknown    VITALS:  Blood pressure (!) 150/90, pulse 60, temperature 97.9 F (36.6 C), temperature source Oral, resp. rate 18, height 5\' 9"  (1.753 m), weight 86.8 kg (191 lb 4.8 oz), SpO2 95 %.  PHYSICAL EXAMINATION:  GENERAL:  83 y.o.-year-old patient lying in the bed with no acute distress.  EYES: Pupils equal, round, reactive to light and accommodation. No scleral icterus. Extraocular muscles intact.  HEENT: Head atraumatic, normocephalic. Oropharynx and nasopharynx clear.  NECK:  Supple, no jugular venous distention. No thyroid enlargement, no tenderness.  LUNGS: Normal breath sounds bilaterally, no wheezing, rales,rhonchi or crepitation. No use of accessory muscles of respiration.  CARDIOVASCULAR: S1, S2 normal. systolic murmurs .  ABDOMEN: Soft,  nontender, nondistended. Bowel sounds present. No organomegaly or mass.  EXTREMITIES: No pedal edema, cyanosis, or clubbing.  NEUROLOGIC: Cranial nerves II through XII are intact. Muscle strength 5/5 in all extremities. Sensation intact. Gait not checked.  PSYCHIATRIC: The patient is alert and oriented x 3.  SKIN: No obvious rash, lesion, or ulcer.   Physical Exam LABORATORY PANEL:   CBC  Recent Labs Lab 01/31/17 0852  WBC 8.3  HGB 14.0  HCT 42.5  PLT 232   ------------------------------------------------------------------------------------------------------------------  Chemistries   Recent Labs Lab 01/30/17 0624  NA 138  K 3.6  CL 101  CO2 27  GLUCOSE 87  BUN 10  CREATININE 0.74  CALCIUM 8.7*   ------------------------------------------------------------------------------------------------------------------  Cardiac Enzymes  Recent Labs Lab 01/30/17 1227 01/30/17 1808  TROPONINI 6.03* 4.77*   ------------------------------------------------------------------------------------------------------------------  RADIOLOGY:  Dg Chest 2 View  Result Date: 01/29/2017 CLINICAL DATA:  Midsternal chest pain. EXAM: CHEST  2 VIEW COMPARISON:  Radiographs 02/13/2017 FINDINGS: Unchanged heart size and mediastinal contours with tortuosity of the thoracic aorta and atherosclerosis. No pulmonary edema. Mild bibasilar atelectasis or scarring, left greater than right. No consolidation, pleural effusion or pneumothorax. Stable osseous structures. IMPRESSION: Aortic tortuosity atherosclerosis.  Mild bibasilar atelectasis. Electronically Signed   By: Rubye Oaks M.D.   On: 01/29/2017 23:57    ASSESSMENT AND PLAN:   Active Problems:   Non-STEMI (non-ST elevated myocardial infarction) (HCC)   * NSTEMI - Continue ASA, heparin drip (1 more day), lopressor, lipitor - Cardiology considering conservative management, considering advanced age and dementia - Echo recently shows  normal LV systolic function and no major wall motion abnormality - suggested  to add plavix and ACE inhibitor, if BP permits on d/c.  * Hypertension  - Continue metoprolol  - May add ACE inhibitor on d/c.  * History of CVA: Continue aspirin and statin  * Hypothyroidism. Continue thyroxine.    All the records are reviewed and case discussed with Care Management/Social Workerr. Management plans discussed with the patient, nursing and they are in agreement.  CODE STATUS: DNR  TOTAL TIME TAKING CARE OF THIS PATIENT: 35 minutes.   POSSIBLE D/C IN 1 DAYS, DEPENDING ON CLINICAL CONDITION.   Delfino LovettVipul Lan Entsminger M.D on 01/31/2017   Between 7am to 6pm - Pager - [phone]  After 6pm go to [email or web address] - password [email or web address]  [facility]  Office  [phone]  CC: Primary care physician; Danella PentonMiller, Mark F, MD  Note: This dictation was prepared with Dragon dictation along with smaller phrase technology. Any transcriptional errors that result from this process are unintentional.

## 2017-01-31 NOTE — Care Management Note (Signed)
Case Management Note  Patient Details  Name: Marylen PontoVester Patrie MRN: [id number] Date of Birth: March 22, 1924  Subjective/Objective:                 Patient is currently followed by [facility] and PT and outpatient pallliative   Action/Plan: Notified [facility] and palliative of admission  Expected Discharge Date:                  Expected Discharge Plan:     In-House Referral:     Discharge planning Services     Post Acute Care Choice:    Choice offered to:     DME Arranged:    DME Agency:     HH Arranged:    HH Agency:     Status of Service:     If discussed at [employer] Meetings, dates discussed:    Additional Comments:  Eber HongGreene, Dois Juarbe R, RN 01/31/2017, 8:34 AM

## 2017-01-31 NOTE — Progress Notes (Signed)
ANTICOAGULATION CONSULT NOTE - Initial Consult  Pharmacy Consult for heparin drip Indication: chest pain/ACS  Allergies  Allergen Reactions  . Sulfa Antibiotics Other (See Comments)    Reaction: Unknown    Patient Measurements: Height: 5\' 9"  (175.3 cm) Weight: 191 lb 4.8 oz (86.8 kg) IBW/kg (Calculated) : 70.7 Heparin Dosing Weight: 87kg  Vital Signs: Temp: 98.5 F (36.9 C) (05/16 2041) Temp Source: Oral (05/16 2041) BP: 136/71 (05/16 2041) Pulse Rate: 72 (05/16 2041)  Labs:  Recent Labs  01/29/17 2324 01/30/17 0624 01/30/17 0712 01/30/17 1227 01/30/17 1631 01/30/17 1808 01/31/17 0125  HGB 15.1  --   --   --   --   --   --   HCT 45.8  --   --   --   --   --   --   PLT 259  --   --   --   --   --   --   APTT  --   --  43*  --   --   --   --   HEPARINUNFRC  --   --   --   --  0.84*  --  0.45  CREATININE 0.86 0.74  --   --   --   --   --   TROPONINI 0.13* 7.18*  --  6.03*  --  4.77*  --     Estimated Creatinine Clearance: 65.6 mL/min (by C-G formula based on SCr of 0.74 mg/dL).   Medical History: Past Medical History:  Diagnosis Date  . cva 2014, 10/30   Right-sided facial droop, and right extremities weakness.  . CVA (cerebrovascular accident due to intracerebral hemorrhage) (HCC) 2010  . GERD (gastroesophageal reflux disease)   . Hypertension   . Hypothyroidism   . MI (myocardial infarction) (HCC)     Medications:  No anticoagulation in PTA meds  Assessment:  Goal of Therapy:  Heparin level 0.3-0.7 units/ml Monitor platelets by anticoagulation protocol: Yes   Plan:  4000 unit bolus and initial rate of 1150 units/hr. First heparin level 8 hours after start of infusion.  5/16 1631 HL 0.84- Level is supratherapeutic. Will decrease infusion to 950u/hr and recheck HL in 8 hours.   5/17 01:30 heparin level 0.45. Continue current regimen. Recheck CBC and heparin level in 8 hours.  Erich MontaneMcBane,Akon Reinoso S, PharmD, BCPS Clinical Pharmacist 01/31/2017 2:50  AM

## 2017-01-31 NOTE — Progress Notes (Signed)
[facility]  Patient: Ryan Cantu / Admit Date: 01/29/2017 / Date of Encounter: 01/31/2017, 8:46 AM   Subjective: Patient feeling much better today and no evidence of chest discomfort at this time. No evidence of heart failure or other significant cardiovascular symptoms. Patient had peak troponin of 7.1 consistent with non-ST elevation myocardial infarction and/or extension of previous myocardial infarction from one month prior. Patient currently on appropriate metoprolol but cannot increase dose due to bradycardia. Patient tolerating high intensity cholesterol therapy in and dual antiplatelet therapy  Review of Systems: Positive for: Weakness Negative for: Vision change, hearing change, syncope, dizziness, nausea, vomiting,diarrhea, bloody stool, stomach pain, cough, congestion, diaphoresis, urinary frequency, urinary pain,skin lesions, skin rashes Others previously listed  Objective: Telemetry: Normal sinus rhythm Physical Exam: Blood pressure (!) 162/80, pulse 60, temperature 98.3 F (36.8 C), temperature source Oral, resp. rate 18, height 5\' 9"  (1.753 m), weight 86.8 kg (191 lb 4.8 oz), SpO2 95 %. Body mass index is 28.25 kg/m. General: Well developed, well nourished, in no acute distress. Head: Normocephalic, atraumatic, sclera non-icteric, no xanthomas, nares are without discharge. Neck: No apparent masses Lungs: Normal respirations with no wheezes, no rhonchi, no rales , few crackles   Heart: Regular rate and rhythm, normal S1 S2, no murmur, no rub, no gallop, PMI is normal size and placement, carotid upstroke normal without bruit, jugular venous pressure normal Abdomen: Soft, non-tender, non-distended with normoactive bowel sounds. No hepatosplenomegaly. Abdominal aorta is normal size without bruit Extremities: No edema, no clubbing, no cyanosis, no ulcers,  Peripheral: 2+ radial, 2+ femoral, 2+ dorsal pedal pulses Neuro: Alert and oriented.  Moves all extremities spontaneously. Psych:  Responds to questions appropriately with a normal affect.   Intake/Output Summary (Last 24 hours) at 01/31/17 0846 Last data filed at 01/31/17 0647  Gross per 24 hour  Intake              916 ml  Output             1950 ml  Net            -1034 ml    Inpatient Medications:  . aspirin EC  81 mg Oral Daily  . atorvastatin  40 mg Oral q1800  . clopidogrel  75 mg Oral Daily  . isosorbide mononitrate  30 mg Oral Daily  . levothyroxine  100 mcg Oral QAC breakfast  . magnesium oxide  400 mg Oral Daily  . metoprolol tartrate  12.5 mg Oral Daily  . pantoprazole  40 mg Oral Daily   Infusions:  . sodium chloride 75 mL/hr at 01/30/17 2237  . heparin 950 Units/hr (01/30/17 2239)    Labs:  Recent Labs  01/29/17 2324 01/30/17 0624  NA 136 138  K 3.7 3.6  CL 101 101  CO2 25 27  GLUCOSE 130* 87  BUN 11 10  CREATININE 0.86 0.74  CALCIUM 8.9 8.7*   No results for input(s): AST, ALT, ALKPHOS, BILITOT, PROT, ALBUMIN in the last 72 hours.  Recent Labs  01/29/17 2324  WBC 9.9  HGB 15.1  HCT 45.8  MCV 85.8  PLT 259    Recent Labs  01/29/17 2324 01/30/17 0624 01/30/17 1227 01/30/17 1808  TROPONINI 0.13* 7.18* 6.03* 4.77*   Invalid input(s): POCBNP No results for input(s): HGBA1C in the last 72 hours.   Weights: Filed Weights   01/29/17 2325 01/30/17 0618  Weight: 89.8 kg (198 lb) 86.8 kg (191 lb 4.8 oz)  Radiology/Studies:  Dg Chest 2 View  Result Date: 01/29/2017 CLINICAL DATA:  Midsternal chest pain. EXAM: CHEST  2 VIEW COMPARISON:  Radiographs 02/13/2017 FINDINGS: Unchanged heart size and mediastinal contours with tortuosity of the thoracic aorta and atherosclerosis. No pulmonary edema. Mild bibasilar atelectasis or scarring, left greater than right. No consolidation, pleural effusion or pneumothorax. Stable osseous structures. IMPRESSION: Aortic tortuosity atherosclerosis.  Mild bibasilar atelectasis. Electronically  Signed   By: Rubye Oaks M.D.   On: 01/29/2017 23:57   Dg Chest 2 View  Result Date: 01/14/2017 CLINICAL DATA:  Chest pain EXAM: CHEST  2 VIEW COMPARISON:  05/19/2015 FINDINGS: Heart size mildly enlarged. Negative for heart failure. Mild atelectasis in the lung bases. Negative for pneumonia or effusion. IMPRESSION: Mild bibasilar atelectasis. Electronically Signed   By: Marlan Palau M.D.   On: 01/14/2017 11:22     Assessment and Recommendation  83 y.o. male with the recurrent non-ST elevation myocardial infarction with possible extension of myocardial infarction from previous month having essential hypertension mixed hyperlipidemia now slightly improved 1. Continue heparin for one more day status post myocardial infarction 2. Dual antiplatelet therapy with Plavix and aspirin 3. Continue metoprolol but no increase in dose due to concerns of bradycardia 4. Isosorbide for collateral flow 5. High intensity cholesterol therapy with atorvastatin 6. Begin ambulation and follow for improvements of symptoms and begin rehabilitation  Signed, Arnoldo Hooker M.D. FACC

## 2017-01-31 NOTE — Progress Notes (Signed)
Notified by [facility] Ermalene SearingNann Greene that patient had been readmitted. Please note [facility]. Will notify home palliative team of admission and faxed updated notes. Thank you. Dayna BarkerKaren Robertson RN, BSN, [facility], [facility] L[facility] [phone] c

## 2017-01-31 NOTE — Progress Notes (Addendum)
ANTICOAGULATION CONSULT NOTE - FOLLOW UP  Consult  Pharmacy Consult for heparin drip Indication: chest pain/ACS  Allergies  Allergen Reactions  . Sulfa Antibiotics Other (See Comments)    Reaction: Unknown    Patient Measurements: Height: 5\' 9"  (175.3 cm) Weight: 191 lb 4.8 oz (86.8 kg) IBW/kg (Calculated) : 70.7 Heparin Dosing Weight: 87kg  Vital Signs: Temp: 97.5 F (36.4 C) (05/17 0854) Temp Source: Oral (05/17 0854) BP: 158/86 (05/17 0854) Pulse Rate: 64 (05/17 0854)  Labs:  Recent Labs  01/29/17 2324 01/30/17 [id number] 01/30/17 [id number] 01/30/17 1227 01/30/17 1631 01/30/17 1808 01/31/17 0125 01/31/17 0852  HGB 15.1  --   --   --   --   --   --  14.0  HCT 45.8  --   --   --   --   --   --  42.5  PLT 259  --   --   --   --   --   --  232  APTT  --   --  43*  --   --   --   --   --   HEPARINUNFRC  --   --   --   --  0.84*  --  0.45 0.59  CREATININE 0.86 0.74  --   --   --   --   --   --   TROPONINI 0.13* 7.18*  --  6.03*  --  4.77*  --   --     Estimated Creatinine Clearance: 65.6 mL/min (by C-G formula based on SCr of 0.74 mg/dL).   Medical History: Past Medical History:  Diagnosis Date  . cva 2014, 10/30   Right-sided facial droop, and right extremities weakness.  . CVA (cerebrovascular accident due to intracerebral hemorrhage) (HCC) 2010  . GERD (gastroesophageal reflux disease)   . Hypertension   . Hypothyroidism   . MI (myocardial infarction) (HCC)     Medications:  No anticoagulation in PTA meds  Assessment:  Goal of Therapy:  Heparin level 0.3-0.7 units/ml Monitor platelets by anticoagulation protocol: Yes   Plan:  4000 unit bolus and initial rate of 1150 units/hr. First heparin level 8 hours after start of infusion.  5/16 1631 HL 0.84- Level is supratherapeutic. Will decrease infusion to 950u/hr and recheck HL in 8 hours.   5/17 01:30 heparin level 0.45. Continue current regimen. Recheck CBC and heparin level in 8 hours.  5/17: Heparin  level therapeutic. Will recheck in am.  Demetrius CharityJames,Teldrin D, PharmD, BCPS Clinical Pharmacist 01/31/2017 11:50 AM   5/18 AM heparin level 0.51. Continue current regimen. Recheck CBC and heparin level with tomorrow AM labs.  Fulton ReekMatt Jhoselin Crume, PharmD, BCPS  02/01/17 5:57 AM

## 2017-02-01 DIAGNOSIS — I214 Non-ST elevation (NSTEMI) myocardial infarction: Secondary | ICD-10-CM

## 2017-02-01 DIAGNOSIS — R079 Chest pain, unspecified: Secondary | ICD-10-CM

## 2017-02-01 DIAGNOSIS — Z7189 Other specified counseling: Secondary | ICD-10-CM

## 2017-02-01 DIAGNOSIS — Z66 Do not resuscitate: Secondary | ICD-10-CM

## 2017-02-01 DIAGNOSIS — Z515 Encounter for palliative care: Secondary | ICD-10-CM

## 2017-02-01 LAB — CBC
HCT: 40.5 % (ref 40.0–52.0)
Hemoglobin: 13.2 g/dL (ref 13.0–18.0)
MCH: 27.9 pg (ref 26.0–34.0)
MCHC: 32.7 g/dL (ref 32.0–36.0)
MCV: 85.4 fL (ref 80.0–100.0)
PLATELETS: 226 10*3/uL (ref 150–440)
RBC: 4.75 MIL/uL (ref 4.40–5.90)
RDW: 16.3 % — ABNORMAL HIGH (ref 11.5–14.5)
WBC: 8.3 10*3/uL (ref 3.8–10.6)

## 2017-02-01 LAB — HEPARIN LEVEL (UNFRACTIONATED): Heparin Unfractionated: 0.51 IU/mL (ref 0.30–0.70)

## 2017-02-01 MED ORDER — LISINOPRIL 5 MG PO TABS
2.5000 mg | ORAL_TABLET | Freq: Every day | ORAL | Status: DC
  Administered 2017-02-01 – 2017-02-02 (×2): 2.5 mg via ORAL
  Filled 2017-02-01 (×2): qty 1

## 2017-02-01 NOTE — Evaluation (Signed)
Physical Therapy Evaluation Patient Details Name: Ryan Cantu MRN: [id number] DOB: 1923-09-27 Today's Date: 02/01/2017   History of Present Illness  Pt is a 83 y.o. male presenting to hospital with chest pain.  Of note, pt with recent hospitalization beginning of May with NSTEMI and treated medically.  Pt now admitted to hospital with NSTEMI with elevated troponin and/or extension of previous MI.  PMH includes CVA, htn, NSTEMI.  Clinical Impression  Prior to hospital admission, pt was ambulating with RW.  Pt lives alone on main level of home with 3 steps to enter with railing.  Currently pt is modified independent with bed mobility and transfers with RW and SBA with ambulation 200 feet with RW.  Pt steady and safe with all functional mobility during session using RW.  Pt's HR 58-80 bpm and O2 92% or greater on room air during session.  Pt would benefit from skilled PT to address noted impairments and functional limitations (see below for any additional details).  Upon hospital discharge, recommend pt discharge to home with support of family/friends and HHPT.    Follow Up Recommendations Home health PT    Equipment Recommendations  Rolling walker with 5" wheels    Recommendations for Other Services       Precautions / Restrictions Precautions Precautions: Fall Restrictions Weight Bearing Restrictions: No      Mobility  Bed Mobility Overal bed mobility: Modified Independent             General bed mobility comments: Supine to sit with HOB elevated without any difficulties.  Transfers Overall transfer level: Modified independent Equipment used: Rolling walker (2 wheeled) Transfers: Sit to/from [employer] Sit to Stand: Modified independent (Device/Increase time) Stand pivot transfers: Modified independent (Device/Increase time)       General transfer comment: strong stand from bed and from chair; steady without loss of  balance  Ambulation/Gait Ambulation/Gait assistance: Supervision Ambulation Distance (Feet): 200 Feet Assistive device: Rolling walker (2 wheeled) Gait Pattern/deviations: WFL(Within Functional Limits)   Gait velocity interpretation: at or above normal speed for age/gender General Gait Details: steady confident ambulation and safe use of walker  Stairs Stairs:  (Deferred d/t short IV line (IV heparin running))          Wheelchair Mobility    Modified Rankin (Stroke Patients Only)       Balance Overall balance assessment: No apparent balance deficits (not formally assessed)                                           Pertinent Vitals/Pain Pain Assessment: No/denies pain     Home Living Family/patient expects to be discharged to:: Private residence Living Arrangements: Alone Available Help at Discharge: Friend(s);Family Type of Home: House Home Access: Stairs to enter Entrance Stairs-Rails: Left Entrance Stairs-Number of Steps: 3 Home Layout: Two level;Able to live on main level with bedroom/bathroom Home Equipment: Tub bench;Walker - 2 wheels;Wheelchair - manual      Prior Function Level of Independence: Independent with assistive device(s)         Comments: Pt denies any falls in past 6 months.  Goes out to eat and runs errands.  Does not drive but his "girlfriend" or daughter takes hime.  Active in general.  Friend stops in to check on him in the morning and afternoon.     Hand Dominance  Extremity/Trunk Assessment   Upper Extremity Assessment Upper Extremity Assessment: Overall WFL for tasks assessed    Lower Extremity Assessment Lower Extremity Assessment: Overall WFL for tasks assessed       Communication   Communication: HOH (Hears better out of R ear)  Cognition Arousal/Alertness: Awake/alert Behavior During Therapy: WFL for tasks assessed/performed Overall Cognitive Status: Within Functional Limits for tasks  assessed                                        General Comments   Nursing cleared pt for participation in physical therapy.  Pt agreeable to PT session.    Exercises     Assessment/Plan    PT Assessment Patient needs continued PT services  PT Problem List Decreased mobility;Decreased balance;Decreased strength       PT Treatment Interventions DME instruction;Gait training;Stair training;Functional mobility training;Therapeutic activities;Therapeutic exercise;Balance training;Patient/family education    PT Goals (Current goals can be found in the Care Plan section)  Acute Rehab PT Goals Patient Stated Goal: go home PT Goal Formulation: With patient/family Time For Goal Achievement: 02/15/17 Potential to Achieve Goals: Good    Frequency Min 2X/week   Barriers to discharge        Co-evaluation               AM-PAC PT "6 Clicks" Daily Activity  Outcome Measure Difficulty turning over in bed (including adjusting bedclothes, sheets and blankets)?: None Difficulty moving from lying on back to sitting on the side of the bed? : None Difficulty sitting down on and standing up from a chair with arms (e.g., wheelchair, bedside commode, etc,.)?: None Help needed moving to and from a bed to chair (including a wheelchair)?: None Help needed walking in hospital room?: A Little Help needed climbing 3-5 steps with a railing? : A Little 6 Click Score: 22    End of Session Equipment Utilized During Treatment: Gait belt Activity Tolerance: Patient tolerated treatment well Patient left: in chair;with call bell/phone within reach;with chair alarm set Nurse Communication: Mobility status;Precautions PT Visit Diagnosis: Difficulty in walking, not elsewhere classified (R26.2);Muscle weakness (generalized) (M62.81)    Time: [id number] PT Time Calculation (min) (ACUTE ONLY): 23 min   Charges:   PT Evaluation $PT Eval Low Complexity: 1 Procedure     PT G CodesHendricks Limes:         Sudie Bandel, PT 02/01/17, 1:43 PM [phone]

## 2017-02-01 NOTE — Progress Notes (Signed)
[facility] [place] [facility]   PATIENT NAME: Ryan Cantu    MR#:  [id number]  DATE OF BIRTH:  11-02-1923  SUBJECTIVE:  CHIEF COMPLAINT:   Chief Complaint  Patient presents with  . Chest Pain  Blood pressure was in 180s, family member concerned about going back home and unable to participate with physical therapy.  Like to talk to palliative care.  Feels very weak REVIEW OF SYSTEMS:  CONSTITUTIONAL: No fever, fatigue or weakness.  EYES: No blurred or double vision.  EARS, NOSE, AND THROAT: No tinnitus or ear pain.  RESPIRATORY: No cough, shortness of breath, wheezing or hemoptysis.  CARDIOVASCULAR: No chest pain, orthopnea, edema.  GASTROINTESTINAL: No nausea, vomiting, diarrhea or abdominal pain.  GENITOURINARY: No dysuria, hematuria.  ENDOCRINE: No polyuria, nocturia,  HEMATOLOGY: No anemia, easy bruising or bleeding SKIN: No rash or lesion. MUSCULOSKELETAL: No joint pain or arthritis.   NEUROLOGIC: No tingling, numbness, weakness.  PSYCHIATRY: No anxiety or depression.   ROS  DRUG ALLERGIES:   Allergies  Allergen Reactions  . Sulfa Antibiotics Other (See Comments)    Reaction: Unknown    VITALS:  Blood pressure 125/79, pulse (!) 57, temperature 98.1 F (36.7 C), temperature source Oral, resp. rate 20, height 5\' 9"  (1.753 m), weight 86.8 kg (191 lb 4.8 oz), SpO2 94 %.  PHYSICAL EXAMINATION:  GENERAL:  83 y.o.-year-old patient lying in the bed with no acute distress.  EYES: Pupils equal, round, reactive to light and accommodation. No scleral icterus. Extraocular muscles intact.  HEENT: Head atraumatic, normocephalic. Oropharynx and nasopharynx clear.  NECK:  Supple, no jugular venous distention. No thyroid enlargement, no tenderness.  LUNGS: Normal breath sounds bilaterally, no wheezing, rales,rhonchi or crepitation. No use of accessory muscles of respiration.  CARDIOVASCULAR: S1, S2 normal. systolic murmurs .  ABDOMEN: Soft, nontender,  nondistended. Bowel sounds present. No organomegaly or mass.  EXTREMITIES: No pedal edema, cyanosis, or clubbing.  NEUROLOGIC: Cranial nerves II through XII are intact. Muscle strength 5/5 in all extremities. Sensation intact. Gait not checked.  PSYCHIATRIC: The patient is alert and oriented x 3.  SKIN: No obvious rash, lesion, or ulcer.   Physical Exam LABORATORY PANEL:   CBC  Recent Labs Lab 02/01/17 0510  WBC 8.3  HGB 13.2  HCT 40.5  PLT 226   ------------------------------------------------------------------------------------------------------------------  Chemistries   Recent Labs Lab 01/30/17 0624  NA 138  K 3.6  CL 101  CO2 27  GLUCOSE 87  BUN 10  CREATININE 0.74  CALCIUM 8.7*   ------------------------------------------------------------------------------------------------------------------  Cardiac Enzymes  Recent Labs Lab 01/30/17 1227 01/30/17 1808  TROPONINI 6.03* 4.77*   ------------------------------------------------------------------------------------------------------------------  RADIOLOGY:  No results found.  ASSESSMENT AND PLAN:   Active Problems:   Non-STEMI (non-ST elevated myocardial infarction) (HCC)   Chest pain   DNR (do not resuscitate)   Goals of care, counseling/discussion   * NSTEMI - Continue ASA, Stop heparin drip, continue lopressor, lipitor - Cardiology considering conservative management, considering advanced age and dementia - Echo recently shows normal LV systolic function and no major wall motion abnormality - suggested to add plavix and ACE inhibitor, if BP permits on d/c.  * Hypertension  - Continue metoprolol  - May add ACE inhibitor on d/c.  * History of CVA: Continue aspirin and statin  * Hypothyroidism. Continue thyroxine.  Palliative care consultation to see if he would be eligible for hospice.  Family is interested in rehabilitation placement, although not sure if he would be able to  tolerate  physical therapy  All the records are reviewed and case discussed with Care Management/Social Worker. Management plans discussed with the patient, nursing and they are in agreement.  CODE STATUS: DNR  TOTAL TIME TAKING CARE OF THIS PATIENT: 35 minutes.   POSSIBLE D/C IN 1-2 DAYS, DEPENDING ON CLINICAL CONDITION.   Delfino LovettVipul Jonael Paradiso M.D on 02/01/2017   Between 7am to 6pm - Pager - [phone]  After 6pm go to [email or web address] - password [email or web address]  [facility]  Office  [phone]  CC: Primary care physician; Danella PentonMiller, Mark F, MD  Note: This dictation was prepared with Dragon dictation along with smaller phrase technology. Any transcriptional errors that result from this process are unintentional.

## 2017-02-01 NOTE — Consult Note (Signed)
Consultation Note Date: 02/01/2017   Patient Name: Ryan Cantu  DOB: [birth year]  MRN: [id number]  Age / Sex: 83 y.o., male  PCP: Rusty Aus, MD Referring Physician: Max Sane, MD  Reason for Consultation: Establishing goals of care  HPI/Patient Profile: 83 y.o. male  with past medical history of MI, hypothyroidism, hypertension, GERD, and CVA admitted on 01/29/2017 with chest pain. Recent hospitalization for NSTEMI and hypertension. Discharged on metoprolol and isosorbide with high intensity cholesterol therapy and antiplatelet medication. In ED, patient with hypertension and troponin level of 7.1 consistent with NSTEMI. Also pulmonary edema with acute heart failure. Cardiology following. Clinical improvement with furosemide and nitroglycerin. On heparin continuous infusion. Per cardiology, conservative management due to age. OK to begin ambulation. PT evaluation pending. Palliative medicine consultation for goals of care.      Clinical Assessment and Goals of Care: I have reviewed medical records, discussed with care team, and met with patient and friend Scientist, water quality) at bedside to discuss diagnosis, [place], EOL wishes, disposition and options. Daughter are out of town and not able to be at the hospital today.   Introduced Palliative Medicine as specialized medical care for people living with serious illness. It focuses on providing relief from the symptoms and stress of a serious illness. The goal is to improve quality of life for both the patient and the family.  We discussed a brief life review of the patient. He served in the [place] during [employer] for the [employer]. After the war, he worked in the [employer] for many years. Living at home independently. He has two supportive daughters and a neighbor friend, Ryan Cantu, that checks in on him. Uses a walker to ambulate. Still able to cook and perform ADL's. Recently hospitalized  but was able to return home independently with home health PT. Ryan Cantu is able to check on him twice a day but is concerned he needs more assistance in the home.   Discussed hospital diagnoses and interventions. Mr. Dia does not want to undergo aggressive procedures that may decrease his functional status. Be more harm than good to him. He understands conservative heart management. He also understands he could continue to have heart attacks or a sudden cardiac event. He confirms he would NOT want to be resuscitated or placed on a breathing machine if he would naturally die.      I attempted to elicit values and goals of care important to the patient. He has lived in his home since 50. It remains important to him to stay at home as long as possible. Ryan Cantu feels he needs more help in the home. She wants to see how he will do with physical therapy--feels he needs to try rehab for a few weeks and then hopeful he can return home. Recommended she consider caregivers to come into the home but knowing this would be an out-of-pocket expensive. She will talk with daughter/POA Ryan Cantu but she is on a boat in [place] and unable to talk today.    Hospice and Palliative Care  services outpatient were explained and offered. I do not feel this patient is eligible for hospice yet. If he continues to decline with chest pain and decreased functional status, palliative services can transition into hospice. Patient and friend agree. He is motivated to work with physical therapy today.   Questions and concerns were addressed. Provided emotional support.    SUMMARY OF RECOMMENDATIONS    DNR/DNI  I do not feel he is eligible yet for hospice services. Patient/friend agreeable with palliative services to follow on discharge knowing this can transition to hospice when appropriate.   If he continues to decline (chest pain/decreased functional status/inability to work with physical therapy), palliative services can transition  to hospice.  PT consulted. May be eligible for rehab.  PMT not at [facility] over the weekend but will f/u Monday if still hospitalized.   Code Status/Advance Care Planning:  DNR   Symptom Management:   Per attending  Palliative Prophylaxis:   Aspiration, Bowel Regimen, Frequent Pain Assessment, Oral Care and Turn Reposition  Additional Recommendations (Limitations, Scope, Preferences):  Full Scope Treatment except DNR/DNI  Psycho-social/Spiritual:   Desire for further Chaplaincy support:no  Additional Recommendations: Caregiving  Support/Resources and Education on Hospice  Prognosis:   Unable to determine: guarded with risk for continued NSTEMI's or cardiac event.   Discharge Planning: To Be Determined home health with palliative versus SNF for rehab with palliative      Primary Diagnoses: Present on Admission: . Non-STEMI (non-ST elevated myocardial infarction) ([place])   I have reviewed the medical record, interviewed the patient and family, and examined the patient. The following aspects are pertinent.  Past Medical History:  Diagnosis Date  . cva 2014, 10/30   Right-sided facial droop, and right extremities weakness.  . CVA (cerebrovascular accident due to intracerebral hemorrhage) ([place]) 2010  . GERD (gastroesophageal reflux disease)   . Hypertension   . Hypothyroidism   . MI (myocardial infarction) [facility])    Social History   Social History  . Marital status: Widowed    Spouse name: widowed  . Number of children: N/A  . Years of education: N/A   Occupational History  . Clinical research associate     Retired   Social History Main Topics  . Smoking status: Never Smoker  . Smokeless tobacco: Never Used  . Alcohol use No  . Drug use: No  . Sexual activity: Not Currently   Other Topics Concern  . None   Social History Narrative  . None   Family History  Problem Relation Age of Onset  . Cancer Mother   . Cancer - Other Father   . Melanoma Brother   . Colon  cancer Brother    Scheduled Meds: . aspirin EC  81 mg Oral Daily  . atorvastatin  40 mg Oral q1800  . clopidogrel  75 mg Oral Daily  . isosorbide mononitrate  30 mg Oral Daily  . levothyroxine  100 mcg Oral QAC breakfast  . magnesium oxide  400 mg Oral Daily  . metoprolol tartrate  12.5 mg Oral Daily  . pantoprazole  40 mg Oral Daily   Continuous Infusions: . sodium chloride 75 mL/hr at 02/01/17 0222  . heparin 950 Units/hr (01/31/17 2201)   PRN Meds:.acetaminophen, nitroGLYCERIN, ondansetron (ZOFRAN) IV Medications Prior to Admission:  Prior to Admission medications   Medication Sig Start Date End Date Taking? Authorizing Provider  aspirin EC 81 MG EC tablet Take 1 tablet (81 mg total) by mouth daily. 01/17/17  Yes Manuella Ghazi, Vipul,  MD  atorvastatin (LIPITOR) 40 MG tablet Take 1 tablet (40 mg total) by mouth daily at 6 PM. 01/16/17  Yes Max Sane, MD  isosorbide mononitrate (IMDUR) 30 MG 24 hr tablet Take 1 tablet (30 mg total) by mouth daily. 01/17/17  Yes Max Sane, MD  levothyroxine (SYNTHROID, LEVOTHROID) 100 MCG tablet Take 100 mcg by mouth daily before breakfast.   Yes [provider]  magnesium oxide (MAG-OX) 400 MG tablet Take 400 mg by mouth daily.   Yes [provider]  metoprolol tartrate (LOPRESSOR) 25 MG tablet Take 0.5 tablets (12.5 mg total) by mouth daily. 01/16/17  Yes Max Sane, MD  Multiple Vitamins-Minerals (PRESERVISION AREDS PO) Take 1 tablet by mouth daily.   Yes [provider]  omeprazole (PRILOSEC) 20 MG capsule Take 20 mg by mouth daily.    Yes [provider]   Allergies  Allergen Reactions  . Sulfa Antibiotics Other (See Comments)    Reaction: Unknown   Review of Systems  Constitutional: Positive for activity change and fatigue.  Respiratory: Positive for shortness of breath.   Cardiovascular: Positive for chest pain.   Physical Exam  Constitutional: He is oriented to person, place, and time. He is cooperative.  HENT:   Head: Normocephalic and atraumatic.  Cardiovascular: Regular rhythm.   Pulmonary/Chest: Effort normal and breath sounds normal.  Abdominal: Normal appearance.  Neurological: He is alert and oriented to person, place, and time.  Hard of hearing  Skin: Skin is warm and dry. There is pallor.  Psychiatric: He has a normal mood and affect. His speech is normal and behavior is normal. Cognition and memory are normal.  Nursing note and vitals reviewed.  Vital Signs: BP (!) 182/87 (BP Location: Right Arm)   Pulse 68   Temp 97.5 F (36.4 C) (Oral)   Resp 18   Ht 5' 9"  (1.753 m)   Wt 86.8 kg (191 lb 4.8 oz)   SpO2 94%   BMI 28.25 kg/m  Pain Assessment: No/denies pain   Pain Score: 0-No pain  SpO2: SpO2: 94 % O2 Device:SpO2: 94 % O2 Flow Rate: .O2 Flow Rate (L/min): 2 L/min  IO: Intake/output summary:   Intake/Output Summary (Last 24 hours) at 02/01/17 1037 Last data filed at 02/01/17 0900  Gross per 24 hour  Intake           3018.4 ml  Output             2725 ml  Net            293.4 ml    LBM: Last BM Date: 01/31/17 Baseline Weight: Weight: 89.8 kg (198 lb) Most recent weight: Weight: 86.8 kg (191 lb 4.8 oz)     Palliative Assessment/Data: PPS 50%   Flowsheet Rows     Most Recent Value  Intake Tab  Referral Department  Hospitalist  Unit at Time of Referral  Cardiac/Telemetry Unit  Palliative Care Primary Diagnosis  Cardiac  Palliative Care Type  Return patient Palliative Care  Reason for referral  Clarify Goals of Care  Date of Admission  01/30/17  Date first seen by Palliative Care  02/01/17  Clinical Assessment  Palliative Performance Scale Score  50%  Psychosocial & Spiritual Assessment  Palliative Care Outcomes  Patient/Family meeting held?  Yes  Who was at the meeting?  patient and friend Scientist, water quality)  [place] goals of care, ACP counseling assistance, Provided end of life care assistance, Counseled regarding hospice, Linked to  palliative care logitudinal support, Provided psychosocial or spiritual support      Time In: 0930 Time Out: 1040 Time Total: 69mn Greater than 50%  of this time was spent counseling and coordinating care related to the above assessment and plan.  Signed by:  MIhor Dow FNP-C Palliative Medicine Team  Phone: [phone]: [phone]  Please contact Palliative Medicine Team phone at [phone] questions and concerns.  For individual provider: See AShea Evans

## 2017-02-01 NOTE — Progress Notes (Signed)
Patient currently is awake watching TV. Complains of no pain or discomfort when asked. Will continue to monitor patient to end of shift.

## 2017-02-01 NOTE — Care Management Important Message (Signed)
Important Message  Patient Details  Name: Ryan Cantu MRN: [id number] Date of Birth: 1923/10/28   Medicare Important Message Given:  Yes  Signed IM notice given to patient's caregiver  that is present     Eber HongGreene, Josh Nicolosi R, RN 02/01/2017, 1:53 PM

## 2017-02-01 NOTE — Progress Notes (Signed)
[facility] Encounter Note  Patient: Ryan Cantu / Admit Date: 01/29/2017 / Date of Encounter: 02/01/2017, 8:48 AM   Subjective: Patient feeling much better today and no evidence of chest discomfort at this time. No evidence of heart failure or other significant cardiovascular symptoms. Patient had peak troponin of 7.1 consistent with non-ST elevation myocardial infarction and/or extension of previous myocardial infarction from one month prior. Patient currently on appropriate metoprolol but cannot increase dose due to bradycardia. Patient tolerating high intensity cholesterol therapy in and dual antiplatelet therapy. Patient ambulated yesterday and this morning with some weakness but no evidence of chest pain or shortness of breath  Review of Systems: Positive for: Weakness Negative for: Vision change, hearing change, syncope, dizziness, nausea, vomiting,diarrhea, bloody stool, stomach pain, cough, congestion, diaphoresis, urinary frequency, urinary pain,skin lesions, skin rashes Others previously listed  Objective: Telemetry: Normal sinus rhythm Physical Exam: Blood pressure (!) 153/81, pulse 65, temperature 98 F (36.7 C), temperature source Oral, resp. rate 16, height 5\' 9"  (1.753 m), weight 86.8 kg (191 lb 4.8 oz), SpO2 90 %. Body mass index is 28.25 kg/m. General: Well developed, well nourished, in no acute distress. Head: Normocephalic, atraumatic, sclera non-icteric, no xanthomas, nares are without discharge. Neck: No apparent masses Lungs: Normal respirations with no wheezes, no rhonchi, no rales , few crackles   Heart: Regular rate and rhythm, normal S1 S2, no murmur, no rub, no gallop, PMI is normal size and placement, carotid upstroke normal without bruit, jugular venous pressure normal Abdomen: Soft, non-tender, non-distended with normoactive bowel sounds. No hepatosplenomegaly. Abdominal aorta is normal size without bruit Extremities: No edema, no  clubbing, no cyanosis, no ulcers,  Peripheral: 2+ radial, 2+ femoral, 2+ dorsal pedal pulses Neuro: Alert and oriented. Moves all extremities spontaneously. Psych:  Responds to questions appropriately with a normal affect.   Intake/Output Summary (Last 24 hours) at 02/01/17 0848 Last data filed at 02/01/17 0755  Gross per 24 hour  Intake           3258.4 ml  Output             2975 ml  Net            283.4 ml    Inpatient Medications:  . aspirin EC  81 mg Oral Daily  . atorvastatin  40 mg Oral q1800  . clopidogrel  75 mg Oral Daily  . isosorbide mononitrate  30 mg Oral Daily  . levothyroxine  100 mcg Oral QAC breakfast  . magnesium oxide  400 mg Oral Daily  . metoprolol tartrate  12.5 mg Oral Daily  . pantoprazole  40 mg Oral Daily   Infusions:  . sodium chloride 75 mL/hr at 02/01/17 0222  . heparin 950 Units/hr (01/31/17 2201)    Labs:  Recent Labs  01/29/17 2324 01/30/17 0624  NA 136 138  K 3.7 3.6  CL 101 101  CO2 25 27  GLUCOSE 130* 87  BUN 11 10  CREATININE 0.86 0.74  CALCIUM 8.9 8.7*   No results for input(s): AST, ALT, ALKPHOS, BILITOT, PROT, ALBUMIN in the last 72 hours.  Recent Labs  01/31/17 0852 02/01/17 0510  WBC 8.3 8.3  HGB 14.0 13.2  HCT 42.5 40.5  MCV 85.3 85.4  PLT 232 226    Recent Labs  01/29/17 2324 01/30/17 0624 01/30/17 1227 01/30/17 1808  TROPONINI 0.13* 7.18* 6.03* 4.77*   Invalid input(s): POCBNP No results for input(s): HGBA1C in the last 72 hours.  Weights: Filed Weights   01/29/17 2325 01/30/17 0618  Weight: 89.8 kg (198 lb) 86.8 kg (191 lb 4.8 oz)     Radiology/Studies:  Dg Chest 2 View  Result Date: 01/29/2017 CLINICAL DATA:  Midsternal chest pain. EXAM: CHEST  2 VIEW COMPARISON:  Radiographs 02/13/2017 FINDINGS: Unchanged heart size and mediastinal contours with tortuosity of the thoracic aorta and atherosclerosis. No pulmonary edema. Mild bibasilar atelectasis or scarring, left greater than right. No  consolidation, pleural effusion or pneumothorax. Stable osseous structures. IMPRESSION: Aortic tortuosity atherosclerosis.  Mild bibasilar atelectasis. Electronically Signed   By: Rubye OaksMelanie  Ehinger M.D.   On: 01/29/2017 23:57   Dg Chest 2 View  Result Date: 01/14/2017 CLINICAL DATA:  Chest pain EXAM: CHEST  2 VIEW COMPARISON:  05/19/2015 FINDINGS: Heart size mildly enlarged. Negative for heart failure. Mild atelectasis in the lung bases. Negative for pneumonia or effusion. IMPRESSION: Mild bibasilar atelectasis. Electronically Signed   By: Marlan Palauharles  Clark M.D.   On: 01/14/2017 11:22     Assessment and Recommendation  83 y.o. male with the recurrent non-ST elevation myocardial infarction with possible extension of myocardial infarction from previous month having essential hypertension mixed hyperlipidemia now slightly improved 1.May discontinue heparin at this time  2. Dual antiplatelet therapy with Plavix and aspirin 3. Continue metoprolol but no increase in dose due to concerns of bradycardia 4. Isosorbide for collateral flow 5. High intensity cholesterol therapy with atorvastatin 6. Begin ambulation and follow for improvements of symptoms and begin rehabilitation 7. After ambulation possible discharge to home or to outside facility for rehabilitation  Signed, Arnoldo HookerBruce Valen Mascaro M.D. FACC

## 2017-02-01 NOTE — Care Management (Signed)
Physical therapy has recommended return home with home health.  Notified [facility].  May benefit from having an aide added to the plan of care.  Palliative will also follow in the home

## 2017-02-01 NOTE — Clinical Social Work Note (Signed)
CSW received referral for SNF.  Case discussed with case manager and plan is to discharge home with home health.  CSW to sign off please re-consult if social work needs arise.  Ryan Cantu, MSW, LCSWA [phone]  

## 2017-02-02 LAB — CBC
HEMATOCRIT: 39.4 % — AB (ref 40.0–52.0)
HEMOGLOBIN: 13.1 g/dL (ref 13.0–18.0)
MCH: 28.2 pg (ref 26.0–34.0)
MCHC: 33.3 g/dL (ref 32.0–36.0)
MCV: 84.9 fL (ref 80.0–100.0)
Platelets: 215 10*3/uL (ref 150–440)
RBC: 4.65 MIL/uL (ref 4.40–5.90)
RDW: 16.2 % — ABNORMAL HIGH (ref 11.5–14.5)
WBC: 7.6 10*3/uL (ref 3.8–10.6)

## 2017-02-02 LAB — BASIC METABOLIC PANEL
Anion gap: 8 (ref 5–15)
BUN: 8 mg/dL (ref 6–20)
CHLORIDE: 107 mmol/L (ref 101–111)
CO2: 27 mmol/L (ref 22–32)
CREATININE: 0.74 mg/dL (ref 0.61–1.24)
Calcium: 8.6 mg/dL — ABNORMAL LOW (ref 8.9–10.3)
GFR calc Af Amer: >60 mL/min (ref >60)
GFR calc non Af Amer: >60 mL/min (ref >60)
GLUCOSE: 91 mg/dL (ref 65–99)
POTASSIUM: 3.6 mmol/L (ref 3.5–5.1)
Sodium: 142 mmol/L (ref 135–145)

## 2017-02-02 MED ORDER — CLOPIDOGREL BISULFATE 75 MG PO TABS: 75.0000 mg | ORAL_TABLET | Freq: Every day | ORAL | 1 refills | Status: AC

## 2017-02-02 MED ORDER — LISINOPRIL 2.5 MG PO TABS: 2.5000 mg | ORAL_TABLET | Freq: Every day | ORAL | 1 refills | Status: AC

## 2017-02-02 NOTE — Care Management Note (Signed)
Case Management Note  Patient Details  Name: [name] MRN: [id number] Date of Birth: [birth year]  Subjective/Objective:     Left message on phone of Vennie HomansMegan Mason, NP at [facility], that Mr Ryan Cantu is being discharged home today with her recommendation for Palliative Care. Call to Sarah at [facility]  Updating her that Mr Ryan Cantu is being discharge hand has orders to resume HH=PT, RN, Aide.              Action/Plan:   Expected Discharge Date:  02/02/17               Expected Discharge Plan:     In-House Referral:     Discharge planning Services     Post Acute Care Choice:    Choice offered to:     DME Arranged:    DME Agency:     HH Arranged:    HH Agency:     Status of Service:     If discussed at [employer] Meetings, dates discussed:    Additional Comments:  Giulio Bertino A, RN 02/02/2017, 1:10 PM

## 2017-02-02 NOTE — Discharge Summary (Signed)
[facility] [place] [facility]   PATIENT NAME: Ryan Cantu    MR#:  [id number]  DATE OF BIRTH:  Jul 12, 1924  DATE OF ADMISSION:  01/29/2017 ADMITTING PHYSICIAN: Ihor Austin, MD  DATE OF DISCHARGE: 02/02/2017  PRIMARY CARE PHYSICIAN: Danella Penton, MD    ADMISSION DIAGNOSIS:  Unstable angina (HCC) [I20.0] Nonspecific chest pain [R07.9]  DISCHARGE DIAGNOSIS:  Active Problems:   Non-STEMI (non-ST elevated myocardial infarction) (HCC)   Chest pain   DNR (do not resuscitate)   Goals of care, counseling/discussion   SECONDARY DIAGNOSIS:   Past Medical History:  Diagnosis Date  . cva 2014, 10/30   Right-sided facial droop, and right extremities weakness.  . CVA (cerebrovascular accident due to intracerebral hemorrhage) (HCC) 2010  . GERD (gastroesophageal reflux disease)   . Hypertension   . Hypothyroidism   . MI (myocardial infarction) [facility])     HOSPITAL COURSE:   * NSTEMI - ContinueASA, Stop heparin drip, continue lopressor, lipitor - Cardiology considering conservative management, considering advanced age and dementia - Echo recently shows normal LV systolic function and no major wall motion abnormality - suggested to add plavix and ACE inhibitor, if BP permits on d/c. - pt is doing fine, and wants to go home. There was a concern by daughter due to his weakness and wanted rehab, but pt walked good with PT eval, so daughter agreed today on sending him home with HHA, PT, RN and he have a girl friend visiting him daily 2-3 times and helping.  * Hypertension  - Continuemetoprolol  - May add ACE inhibitor on d/c.  * History of CVA: Continue aspirin and statin  * Hypothyroidism. Continue thyroxine.   DISCHARGE CONDITIONS:   Stable.  CONSULTS OBTAINED:  Treatment Team:  Lamar Blinks, MD  DRUG ALLERGIES:   Allergies  Allergen Reactions  . Sulfa Antibiotics Other (See Comments)    Reaction: Unknown    DISCHARGE  MEDICATIONS:   Current Discharge Medication List    START taking these medications   Details  clopidogrel (PLAVIX) 75 MG tablet Take 1 tablet (75 mg total) by mouth daily. Qty: 30 tablet, Refills: 1    lisinopril (PRINIVIL,ZESTRIL) 2.5 MG tablet Take 1 tablet (2.5 mg total) by mouth daily. Qty: 30 tablet, Refills: 1      CONTINUE these medications which have NOT CHANGED   Details  aspirin EC 81 MG EC tablet Take 1 tablet (81 mg total) by mouth daily. Qty: 30 tablet, Refills: 0    atorvastatin (LIPITOR) 40 MG tablet Take 1 tablet (40 mg total) by mouth daily at 6 PM. Qty: 30 tablet, Refills: 0    isosorbide mononitrate (IMDUR) 30 MG 24 hr tablet Take 1 tablet (30 mg total) by mouth daily. Qty: 30 tablet, Refills: 0    levothyroxine (SYNTHROID, LEVOTHROID) 100 MCG tablet Take 100 mcg by mouth daily before breakfast.    magnesium oxide (MAG-OX) 400 MG tablet Take 400 mg by mouth daily.    metoprolol tartrate (LOPRESSOR) 25 MG tablet Take 0.5 tablets (12.5 mg total) by mouth daily. Qty: 30 tablet, Refills: 0    Multiple Vitamins-Minerals (PRESERVISION AREDS PO) Take 1 tablet by mouth daily.    omeprazole (PRILOSEC) 20 MG capsule Take 20 mg by mouth daily.          DISCHARGE INSTRUCTIONS:    follow with cardiologist in 1-2 weeks.  If you experience worsening of your admission symptoms, develop shortness of breath, life threatening emergency, suicidal  or homicidal thoughts you must seek medical attention immediately by calling 911 or calling your MD immediately  if symptoms less severe.  You Must read complete instructions/literature along with all the possible adverse reactions/side effects for all the Medicines you take and that have been prescribed to you. Take any new Medicines after you have completely understood and accept all the possible adverse reactions/side effects.   Please note  You were cared for by a hospitalist during your hospital stay. If you have any  questions about your discharge medications or the care you received while you were in the hospital after you are discharged, you can call the unit and asked to speak with the hospitalist on call if the hospitalist that took care of you is not available. Once you are discharged, your primary care physician will handle any further medical issues. Please note that NO REFILLS for any discharge medications will be authorized once you are discharged, as it is imperative that you return to your primary care physician (or establish a relationship with a primary care physician if you do not have one) for your aftercare needs so that they can reassess your need for medications and monitor your lab values.    Today   CHIEF COMPLAINT:   Chief Complaint  Patient presents with  . Chest Pain    HISTORY OF PRESENT ILLNESS:  Ryan Cantu  is a 83 y.o. male with a known history of CVA, GERD, hypertension, hypothyroidism, myocardial infarction presented to the emergency room with chest pain since yesterday night. Pain is located in the left side of the chest was sharp in nature 6 out of 10 on a scale of 1-10. Patient was recently managed at our hospital for non-STEMI in the first week of May. He sees [facility] clinic cardiology. No complaints of any shortness of breath, palpitations. Patient was evaluated in the emergency room troponin was elevated. Hospitalist service was consulted for further care of the patient.   VITAL SIGNS:  Blood pressure (!) 168/92, pulse 60, temperature 98 F (36.7 C), resp. rate 18, height 5\' 9"  (1.753 m), weight 86.8 kg (191 lb 4.8 oz), SpO2 93 %.  I/O:   Intake/Output Summary (Last 24 hours) at 02/02/17 1235 Last data filed at 02/02/17 1145  Gross per 24 hour  Intake          1214.14 ml  Output             3200 ml  Net         -1985.86 ml    PHYSICAL EXAMINATION:   GENERAL:  83 y.o.-year-old patient lying in the bed with no acute distress.  EYES: Pupils equal, round,  reactive to light and accommodation. No scleral icterus. Extraocular muscles intact.  HEENT: Head atraumatic, normocephalic. Oropharynx and nasopharynx clear.  NECK:  Supple, no jugular venous distention. No thyroid enlargement, no tenderness.  LUNGS: Normal breath sounds bilaterally, no wheezing, rales,rhonchi or crepitation. No use of accessory muscles of respiration.  CARDIOVASCULAR: S1, S2 normal. systolic murmurs .  ABDOMEN: Soft, nontender, nondistended. Bowel sounds present. No organomegaly or mass.  EXTREMITIES: No pedal edema, cyanosis, or clubbing.  NEUROLOGIC: Cranial nerves II through XII are intact. Muscle strength 5/5 in all extremities. Sensation intact. Gait not checked.  PSYCHIATRIC: The patient is alert and oriented x 3.  SKIN: No obvious rash, lesion, or ulcer.   DATA REVIEW:   CBC  Recent Labs Lab 02/02/17 0505  WBC 7.6  HGB 13.1  HCT 39.4*  PLT 215    Chemistries   Recent Labs Lab 02/02/17 0505  NA 142  K 3.6  CL 107  CO2 27  GLUCOSE 91  BUN 8  CREATININE 0.74  CALCIUM 8.6*    Cardiac Enzymes  Recent Labs Lab 01/30/17 1808  TROPONINI 4.77*    Microbiology Results  Results for orders placed or performed during the hospital encounter of 04/30/09  Urine culture     Status: None   Collection Time: 05/10/09  7:19 AM  Result Value Ref Range Status   Specimen Description URINE, CATHETERIZED  Final   Special Requests ADDED 0950  Final   Colony Count 85,000 COLONIES/ML  Final   Culture ENTEROCOCCUS SPECIES  Final   Report Status 05/13/2009 FINAL  Final   Organism ID, Bacteria ENTEROCOCCUS SPECIES  Final      Susceptibility   Enterococcus species - MIC    AMPICILLIN <=2 Sensitive     LEVOFLOXACIN >=8 Resistant     NITROFURANTOIN <=16 Sensitive     VANCOMYCIN 1 Sensitive   Culture, blood (routine x 2)     Status: None   Collection Time: 05/10/09  7:34 AM  Result Value Ref Range Status   Specimen Description BLOOD LEFT ARM  Final   Special  Requests   Final    BOTTLES DRAWN AEROBIC AND ANAEROBIC 10CC  PT ON ROCEPHIN   Culture   Final    ENTEROCOCCUS SPECIES Note: COMBINATION THERAPY OF HIGH DOSE AMPICILLIN OR VANCOMYCIN, PLUS AN AMINOGLYCOSIDE, IS USUALLY INDICATED FOR SERIOUS ENTEROCOCCAL INFECTIONS. Note: Gram Stain Report Called to,Read Back By and Verified With:  MARK FIBINGE @ [phone] ON [phone] M CYCOTTE   Report Status 05/13/2009 FINAL  Final   Organism ID, Bacteria ENTEROCOCCUS SPECIES  Final      Susceptibility   Enterococcus species - MIC    AMPICILLIN <=2 Sensitive     VANCOMYCIN 1 Sensitive     GENTAMICIN SYNERGY SENSITIVE    Culture, blood (routine x 2)     Status: None   Collection Time: 05/10/09  7:40 AM  Result Value Ref Range Status   Specimen Description BLOOD LEFT HAND  Final   Special Requests   Final    BOTTLES DRAWN AEROBIC AND ANAEROBIC 10CC  PT ON ROCEPHIN   Culture   Final    ENTEROCOCCUS SPECIES Note: SUSCEPTIBILITIES PERFORMED ON PREVIOUS CULTURE WITHIN THE LAST 5 DAYS. 25 Note: Gram Stain Report Called to,Read Back By and Verified With: KELSEY LEONARD @ 0124 ON 8 10 N WILSON   Report Status 05/13/2009 FINAL  Final  Culture, sputum-assessment     Status: None   Collection Time: 05/10/09  1:59 PM  Result Value Ref Range Status   Specimen Description SPUTUM  Final   Special Requests NONE  Final   Sputum evaluation   Final    MICROSCOPIC FINDINGS SUGGEST THAT THIS SPECIMEN IS NOT REPRESENTATIVE OF LOWER RESPIRATORY SECRETIONS. PLEASE RECOLLECT. CALLED TO J.WOODY,RN 05/10/09 1425 BY BSLADE   Report Status 05/10/2009 FINAL  Final  Culture, respiratory     Status: None   Collection Time: 05/10/09  4:16 PM  Result Value Ref Range Status   Specimen Description ENDOTRACHEAL ASPIRATE  Final   Special Requests NONE  Final   Gram Stain   Final    MODERATE WBC PRESENT,BOTH PMN AND MONONUCLEAR FEW SQUAMOUS EPITHELIAL CELLS PRESENT FEW GRAM POSITIVE RODS FEW YEAST   Culture MODERATE CANDIDA ALBICANS   Final   Report Status 05/13/2009 FINAL  Final  CSF cell count with differential     Status: Abnormal   Collection Time: 05/10/09  4:24 PM  Result Value Ref Range Status   Tube # 3  Final   Color, CSF RED (A) COLORLESS Final   Appearance, CSF BLOODY (A) CLEAR Final   Supernatant XANTHOCHROMIC  Final   RBC Count, CSF 20025 (H) 0 /cu mm Final   WBC, CSF 3 0 - 5 /cu mm Final   Segmented Neutrophils-CSF RARE 0 - 6 % Final   Lymphs, CSF FEW 40 - 80 % Final   Monocyte-Macrophage-Spinal Fluid OCCASIONAL 15 - 45 % Final   Other Cells, CSF TOO FEW TO COUNT, SMEAR AVAILABLE FOR REVIEW  Final  Protein and glucose, CSF     Status: Abnormal   Collection Time: 05/10/09  4:24 PM  Result Value Ref Range Status   Glucose, CSF 44 43 - 76 mg/dL Final   Total  Protein, CSF 268 RESULTS CONFIRMED BY MANUAL DILUTION (H) 15 - 45 mg/dL Final  AFB culture     Status: None   Collection Time: 05/10/09  5:00 PM  Result Value Ref Range Status   Specimen Description CSF  Final   Special Requests NONE  Final   Acid Fast Smear NO ACID FAST BACILLI SEEN  Final   Culture NO ACID FAST BACILLI ISOLATED IN 6 WEEKS  Final   Report Status 06/21/2009 FINAL  Final  Fungus culture     Status: None   Collection Time: 05/10/09  5:00 PM  Result Value Ref Range Status   Specimen Description CSF  Final   Special Requests NONE  Final   Fungal Smear NO YEAST OR FUNGAL ELEMENTS SEEN  Final   Culture No Fungi Isolated in 4 Weeks  Final   Report Status 06/13/2009 FINAL  Final  CSF culture     Status: None   Collection Time: 05/10/09  5:00 PM  Result Value Ref Range Status   Specimen Description CSF  Final   Special Requests IMMUNE:NORM  Final   Gram Stain CYTOSPIN NO WBC SEEN NO ORGANISMS SEEN  Final   Culture NO GROWTH 3 DAYS  Final   Report Status 05/14/2009 FINAL  Final  Urine culture     Status: None   Collection Time: 05/14/09 11:16 AM  Result Value Ref Range Status   Specimen Description URINE, RANDOM  Final    Special Requests NONE  Final   Colony Count NO GROWTH  Final   Culture NO GROWTH  Final   Report Status 05/15/2009 FINAL  Final    RADIOLOGY:  No results found.  EKG:   Orders placed or performed during the hospital encounter of 01/29/17  . ED EKG within 10 minutes  . ED EKG within 10 minutes  . EKG 12-Lead  . EKG 12-Lead      Management plans discussed with the patient, family and they are in agreement.  CODE STATUS:     Code Status Orders        Start     Ordered   01/30/17 0618  Do not attempt resuscitation (DNR)  Continuous    Question Answer Comment  In the event of cardiac or respiratory ARREST Do not call a "code blue"   In the event of cardiac or respiratory ARREST Do not perform Intubation, CPR, defibrillation or ACLS   In the event of cardiac or respiratory ARREST Use medication by any route, position, wound care, and other measures to relive  pain and suffering. May use oxygen, suction and manual treatment of airway obstruction as needed for comfort.      01/30/17 0617    Code Status History    Date Active Date Inactive Code Status Order ID Comments User Context   01/14/2017  2:17 PM 01/16/2017  6:29 PM DNR [id number]  Shaune Pollack, MD Inpatient   09/08/2016  3:20 PM 09/09/2016  5:29 PM Full Code [id number]  Hower, Cletis [place], MD ED   05/19/2015  2:56 AM 05/23/2015  8:14 PM Full Code [id number]  Crissie Figures, MD Inpatient    Advance Directive Documentation     Most Recent Value  Type of Advance Directive  Healthcare Power of Attorney  Pre-existing out of facility DNR order (yellow form or pink MOST form)  -  "MOST" Form in Place?  -      TOTAL TIME TAKING CARE OF THIS PATIENT: 35 minutes.    Altamese Dilling M.D on 02/02/2017 at 12:35 PM  Between 7am to 6pm - Pager - [phone]  After 6pm go to [email or web address] - password [email or web address]  [facility] [place]  Office  [phone]  CC: Primary care physician; Danella Penton, MD   Note: This  dictation was prepared with Dragon dictation along with smaller phrase technology. Any transcriptional errors that result from this process are unintentional.

## 2017-02-02 NOTE — Discharge Instructions (Signed)

## 2017-02-17 ENCOUNTER — Emergency Department
Admission: EM | Admit: 2017-02-17 | Discharge: 2017-02-17 | Disposition: A | Discharge status: Home / Self Care | Payer: Medicare Other | Attending: Emergency Medicine | Admitting: Emergency Medicine

## 2017-02-17 DIAGNOSIS — Z79899 Other long term (current) drug therapy: Secondary | ICD-10-CM | POA: Diagnosis not present

## 2017-02-17 DIAGNOSIS — G501 Atypical facial pain: Secondary | ICD-10-CM | POA: Insufficient documentation

## 2017-02-17 DIAGNOSIS — I252 Old myocardial infarction: Secondary | ICD-10-CM | POA: Insufficient documentation

## 2017-02-17 DIAGNOSIS — E039 Hypothyroidism, unspecified: Secondary | ICD-10-CM | POA: Insufficient documentation

## 2017-02-17 DIAGNOSIS — I10 Essential (primary) hypertension: Secondary | ICD-10-CM | POA: Insufficient documentation

## 2017-02-17 DIAGNOSIS — R519 Headache, unspecified: Secondary | ICD-10-CM

## 2017-02-17 DIAGNOSIS — R200 Anesthesia of skin: Secondary | ICD-10-CM | POA: Diagnosis present

## 2017-02-17 DIAGNOSIS — Z7982 Long term (current) use of aspirin: Secondary | ICD-10-CM | POA: Insufficient documentation

## 2017-02-17 DIAGNOSIS — Z7902 Long term (current) use of antithrombotics/antiplatelets: Secondary | ICD-10-CM | POA: Diagnosis not present

## 2017-02-17 DIAGNOSIS — R51 Headache: Secondary | ICD-10-CM

## 2017-02-17 DIAGNOSIS — Z8673 Personal history of transient ischemic attack (TIA), and cerebral infarction without residual deficits: Secondary | ICD-10-CM | POA: Insufficient documentation

## 2017-02-17 DIAGNOSIS — B029 Zoster without complications: Secondary | ICD-10-CM

## 2017-02-17 LAB — COMPREHENSIVE METABOLIC PANEL
ALT: 20 U/L (ref 17–63)
AST: 36 U/L (ref 15–41)
Albumin: 3.8 g/dL (ref 3.5–5.0)
Alkaline Phosphatase: 119 U/L (ref 38–126)
Anion gap: 8 (ref 5–15)
BILIRUBIN TOTAL: 1.5 mg/dL — AB (ref 0.3–1.2)
BUN: 10 mg/dL (ref 6–20)
CHLORIDE: 97 mmol/L — AB (ref 101–111)
CO2: 24 mmol/L (ref 22–32)
CREATININE: 0.9 mg/dL (ref 0.61–1.24)
Calcium: 8.5 mg/dL — ABNORMAL LOW (ref 8.9–10.3)
Glucose, Bld: 106 mg/dL — ABNORMAL HIGH (ref 65–99)
POTASSIUM: 4.2 mmol/L (ref 3.5–5.1)
Sodium: 129 mmol/L — ABNORMAL LOW (ref 135–145)
TOTAL PROTEIN: 7.2 g/dL (ref 6.5–8.1)

## 2017-02-17 LAB — CBC WITH DIFFERENTIAL/PLATELET
BASOS ABS: 0 10*3/uL (ref 0–0.1)
Basophils Relative: 1 %
EOS ABS: 0.2 10*3/uL (ref 0–0.7)
EOS PCT: 3 %
HCT: 43.4 % (ref 40.0–52.0)
HEMOGLOBIN: 14.6 g/dL (ref 13.0–18.0)
LYMPHS PCT: 23 %
Lymphs Abs: 1.6 10*3/uL (ref 1.0–3.6)
MCH: 28.7 pg (ref 26.0–34.0)
MCHC: 33.7 g/dL (ref 32.0–36.0)
MCV: 85.3 fL (ref 80.0–100.0)
Monocytes Absolute: 1.1 10*3/uL — ABNORMAL HIGH (ref 0.2–1.0)
Monocytes Relative: 16 %
NEUTROS PCT: 57 %
Neutro Abs: 4 10*3/uL (ref 1.4–6.5)
PLATELETS: 196 10*3/uL (ref 150–440)
RBC: 5.08 MIL/uL (ref 4.40–5.90)
RDW: 16.8 % — ABNORMAL HIGH (ref 11.5–14.5)
WBC: 7 10*3/uL (ref 3.8–10.6)

## 2017-02-17 MED ORDER — CEPHALEXIN 500 MG PO CAPS
500.0000 mg | ORAL_CAPSULE | Freq: Once | ORAL | Status: AC
  Administered 2017-02-17: 500 mg via ORAL
  Filled 2017-02-17: qty 1

## 2017-02-17 MED ORDER — VALACYCLOVIR HCL 1 G PO TABS: 1000.0000 mg | ORAL_TABLET | Freq: Three times a day (TID) | ORAL | 0 refills | Status: DC

## 2017-02-17 MED ORDER — TRAMADOL HCL 50 MG PO TABS
50.0000 mg | ORAL_TABLET | Freq: Once | ORAL | Status: AC
  Administered 2017-02-17: 50 mg via ORAL
  Filled 2017-02-17: qty 1

## 2017-02-17 MED ORDER — VALACYCLOVIR HCL 500 MG PO TABS
1000.0000 mg | ORAL_TABLET | Freq: Once | ORAL | Status: AC
  Administered 2017-02-17: 1000 mg via ORAL
  Filled 2017-02-17: qty 2

## 2017-02-17 MED ORDER — TRAMADOL HCL 50 MG PO TABS: 50.0000 mg | ORAL_TABLET | Freq: Four times a day (QID) | ORAL | 0 refills | Status: DC | PRN

## 2017-02-17 MED ORDER — CEPHALEXIN 500 MG PO CAPS: 500.0000 mg | ORAL_CAPSULE | Freq: Two times a day (BID) | ORAL | 0 refills | Status: DC

## 2017-02-17 NOTE — ED Notes (Signed)
Pt. Presents with redness to lt. Side of face and neck with redness and swelling.  Pt. And pt. Family thought pt. Had gotten bit by a insect.  Pt. Family states applying cortisone cream to area yesterday with relief. Pt. States increased swelling today.

## 2017-02-17 NOTE — ED Provider Notes (Signed)
[facility] Emergency Department Provider Note   ____________________________________________   First MD Initiated Contact with Patient 02/17/17 [id number]     (approximate)  I have reviewed the triage vital signs and the nursing notes.   HISTORY  Chief Complaint Numbness    HPI Ryan Cantu is a 83 y.o. male who reported numbness around his left ear yesterday. Started to have some pain in that area as well left jaw left side of the neck and in tonight it got red and swollen and more painful.  Past Medical History:  Diagnosis Date  . cva 2014, 10/30   Right-sided facial droop, and right extremities weakness.  . CVA (cerebrovascular accident due to intracerebral hemorrhage) (HCC) 2010  . GERD (gastroesophageal reflux disease)   . Hypertension   . Hypothyroidism   . MI (myocardial infarction) [facility](HCC)     Patient Active Problem List   Diagnosis Date Noted  . Chest pain   . DNR (do not resuscitate)   . Goals of care, counseling/discussion   . Non-STEMI (non-ST elevated myocardial infarction) (HCC) 01/30/2017  . Palliative care by specialist   . Advance care planning   . NSTEMI (non-ST elevated myocardial infarction) (HCC) 01/14/2017  . Acute posterior epistaxis 09/08/2016  . Weakness of right lower extremity 05/19/2015  . History of CVA (cerebrovascular accident) 05/19/2015  . Dizziness 07/30/2013  . GERD (gastroesophageal reflux disease) 07/30/2013  . CVA (cerebral vascular accident) (HCC) 07/23/2013  . Hypothyroidism 07/23/2013  . Essential hypertension 07/23/2013  . 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 EYE SURGERY      Prior to Admission medications   Medication Sig Start Date End Date Taking? Authorizing Provider  aspirin EC 81 MG EC tablet Take 1 tablet (81 mg total) by mouth daily. 01/17/17   Delfino LovettShah, Vipul, MD  atorvastatin (LIPITOR) 40 MG tablet Take 1 tablet (40  mg total) by mouth daily at 6 PM. 01/16/17   Delfino LovettShah, Vipul, MD  clopidogrel (PLAVIX) 75 MG tablet Take 1 tablet (75 mg total) by mouth daily. 02/03/17   Altamese DillingVachhani, Vaibhavkumar, MD  isosorbide mononitrate (IMDUR) 30 MG 24 hr tablet Take 1 tablet (30 mg total) by mouth daily. 01/17/17   Delfino LovettShah, Vipul, MD  levothyroxine (SYNTHROID, LEVOTHROID) 100 MCG tablet Take 100 mcg by mouth daily before breakfast.    [provider]  lisinopril (PRINIVIL,ZESTRIL) 2.5 MG tablet Take 1 tablet (2.5 mg total) by mouth daily. 02/03/17   Altamese DillingVachhani, Vaibhavkumar, MD  magnesium oxide (MAG-OX) 400 MG tablet Take 400 mg by mouth daily.    [provider]  metoprolol tartrate (LOPRESSOR) 25 MG tablet Take 0.5 tablets (12.5 mg total) by mouth daily. 01/16/17   Delfino LovettShah, Vipul, MD  Multiple Vitamins-Minerals (PRESERVISION AREDS PO) Take 1 tablet by mouth daily.    [provider]  omeprazole (PRILOSEC) 20 MG capsule Take 20 mg by mouth daily.     [provider]    Allergies Sulfa antibiotics  Family History  Problem Relation Age of Onset  . Cancer Mother   . Cancer - Other Father   . Melanoma Brother   . Colon cancer Brother     Social History Social History  Substance Use Topics  . Smoking status: Never Smoker  . Smokeless tobacco: Never Used  . Alcohol use No    Review of Systems  Constitutional: No fever/chills Eyes: No visual changes. ENT: No sore throat. Cardiovascular: Denies  chest pain. Respiratory: Denies shortness of breath. Gastrointestinal: No abdominal pain.  No nausea, no vomiting.  No diarrhea.  No constipation. Genitourinary: Negative for dysuria. Musculoskeletal: Negative for back pain. Skin: Negative for rash. Neurological: Negative for headaches, focal weakness or numbness.   ____________________________________________   PHYSICAL EXAM:  VITAL SIGNS: ED Triage Vitals  Enc Vitals Group     BP 02/17/17 0555 (!) 170/92     Pulse Rate 02/17/17 0555 74      Resp 02/17/17 0555 18     Temp 02/17/17 0555 98.3 F (36.8 C)     Temp Source 02/17/17 0555 Oral     SpO2 02/17/17 0555 93 %     Weight 02/17/17 0556 195 lb (88.5 kg)     Height 02/17/17 0556 5\' 9"  (1.753 m)     Head Circumference --      Peak Flow --      Pain Score 02/17/17 0555 10     Pain Loc --      Pain Edu? --      Excl. in GC? --     Constitutional: Alert and oriented. Well appearing and in no acute distress. Eyes: Conjunctivae are normal. PERRL. EOMI. Head: Atraumatic. Nose: No congestion/rhinnorhea. Mouth/Throat: Mucous membranes are moist.  Oropharynx non-erythematous. Neck: No stridor.patient's left ear is red and swollen left side of the cheek and jaw and left side of neck crosses the midline there in the top of the left shoulder is red and there is some crusty golden colored lesions no obvious blisters.Cardiovascular: Normal rate, regular rhythm. Grossly normal heart sounds.  Good peripheral circulation. Respiratory: Normal respiratory effort.  No retractions. Lungs CTAB. Gastrointestinal: Soft and nontender. No distention. No abdominal bruits. No CVA tenderness. Musculoskeletal: No lower extremity tenderness nor edema.  No joint effusions. Neurologic:  Normal speech and language. No gross focal neurologic deficits are appreciated. No gait instability. Skin:  See above for description of the rash on the patient's left face Psychiatric: Mood and affect are normal. Speech and behavior are normal.  ____________________________________________   LABS (all labs ordered are listed, but only abnormal results are displayed)  Labs Reviewed  COMPREHENSIVE METABOLIC PANEL  CBC WITH DIFFERENTIAL/PLATELET   ____________________________________________  EKG   ____________________________________________  RADIOLOGY  ____________________________________________   PROCEDURES  Procedure(s) performed Procedures  Critical Care performed:    ____________________________________________   INITIAL IMPRESSION / ASSESSMENT AND PLAN / ED COURSE  Pertinent labs & imaging results that were available during my care of the patient were reviewed by me and considered in my medical decision making (see chart for details).   Labs pending, signed out to Dr Lenard Lance     ____________________________________________   FINAL CLINICAL IMPRESSION(S) / ED DIAGNOSES  Final diagnoses:  Facial pain      NEW MEDICATIONS STARTED DURING THIS VISIT:  New Prescriptions   No medications on file     Note:  This document was prepared using Dragon voice recognition software and may include unintentional dictation errors.    Arnaldo Natal, MD 02/17/17 [id number]

## 2017-02-17 NOTE — ED Provider Notes (Signed)
-----------------------------------------   7:59 AM on 02/17/2017 -----------------------------------------  Patient care assumed from Dr. Darnelle CatalanMalinda. On my examination the patient's rash appears most consistent with herpes zoster. The rash clearly demarcates at the midline anteriorly and posteriorly. Overall erythematous with several smaller satellite lesions, a few lesions have a slightly yellow drainage, again consistent with herpes zoster. Patient has pain over this area as well which she describes more of a burning/piercing type pain again consistent with herpes zoster. However given the quick onset of the rash was spreading erythema we will cover her for a bacterial infection as well as herpes zoster. Overall the patient appears well. Patient's labs have resulted showing a normal white blood cell count. Patient is afebrile with normal vitals besides mild hypertension for age. Patient's chemistry has resulted showing a lower sodium level of 129. This appears to be fairly new compared to older labs. Prior labs appear to however in the mid 130s, has been as low as 133 in the past. The patient has good primary care follow-up. I discussed with the patient starting antivirals as well as anti-biotics, and having the patient follow-up with his primary care doctor tomorrow for recheck as well as repeat of his sodium level. Patient and family are agreeable to this plan. Patient is also asking for a pain medication. We will discharge with Ultram for pain control patient may also use Tylenol.   Minna AntisPaduchowski, Numan Zylstra, MD 02/17/17 [id number]

## 2017-02-17 NOTE — ED Triage Notes (Signed)
Pt reports pain and numbness to left side of his face radiating down the top of his left arm; redness and swelling noted to left side of pt's neck that radiates up and around left ear as well as down around nape of neck and over to shoulder; pt says area feels "dead" but then rates his pain 10/10; pt denies difficulty swallowing; pt had MI in 01/14/17 and 01/29/17; pt alert and oriented; able to answer questions without difficulty;

## 2017-02-17 NOTE — ED Notes (Signed)
Pt. Also has what looks like draining from lt. Ear.

## 2017-02-17 NOTE — Discharge Instructions (Signed)
as we discussed please take your antibiotics as well as antiviral medications as prescribed for their entire course. Please call Dr. Hyacinth MeekerMiller tomorrow morning to arrange a follow-up appointment for Monday or Tuesday for recheck/reevaluation, and also to recheck labs including your sodium level. Today's sodium level is low at 129. Return to the emergency department for any significant spreading of rash, if you develop a fever, or for any other symptom personally concerning to yourself.

## 2017-02-17 NOTE — ED Notes (Addendum)
Given something to drink, ok per dr Darnelle Catalanmalinda. No other needs at this time. Pt alert and oriented, NAD.

## 2017-06-26 ENCOUNTER — Inpatient Hospital Stay
Admission: EM | Admit: 2017-06-26 | Discharge: 2017-06-27 | DRG: 282 | Disposition: A | Discharge status: Home Health | Payer: Medicare Other | Attending: Internal Medicine | Admitting: Internal Medicine

## 2017-06-26 ENCOUNTER — Encounter: Payer: Self-pay | Admitting: Emergency Medicine

## 2017-06-26 ENCOUNTER — Emergency Department: Payer: Medicare Other

## 2017-06-26 DIAGNOSIS — E785 Hyperlipidemia, unspecified: Secondary | ICD-10-CM | POA: Diagnosis present

## 2017-06-26 DIAGNOSIS — Z79899 Other long term (current) drug therapy: Secondary | ICD-10-CM

## 2017-06-26 DIAGNOSIS — E039 Hypothyroidism, unspecified: Secondary | ICD-10-CM | POA: Diagnosis present

## 2017-06-26 DIAGNOSIS — I252 Old myocardial infarction: Secondary | ICD-10-CM | POA: Diagnosis not present

## 2017-06-26 DIAGNOSIS — I214 Non-ST elevation (NSTEMI) myocardial infarction: Principal | ICD-10-CM | POA: Diagnosis present

## 2017-06-26 DIAGNOSIS — K219 Gastro-esophageal reflux disease without esophagitis: Secondary | ICD-10-CM | POA: Diagnosis present

## 2017-06-26 DIAGNOSIS — I10 Essential (primary) hypertension: Secondary | ICD-10-CM | POA: Diagnosis present

## 2017-06-26 DIAGNOSIS — R748 Abnormal levels of other serum enzymes: Secondary | ICD-10-CM | POA: Diagnosis not present

## 2017-06-26 DIAGNOSIS — I739 Peripheral vascular disease, unspecified: Secondary | ICD-10-CM | POA: Diagnosis present

## 2017-06-26 DIAGNOSIS — Z8673 Personal history of transient ischemic attack (TIA), and cerebral infarction without residual deficits: Secondary | ICD-10-CM

## 2017-06-26 DIAGNOSIS — Z515 Encounter for palliative care: Secondary | ICD-10-CM | POA: Diagnosis not present

## 2017-06-26 DIAGNOSIS — R079 Chest pain, unspecified: Secondary | ICD-10-CM

## 2017-06-26 DIAGNOSIS — R778 Other specified abnormalities of plasma proteins: Secondary | ICD-10-CM

## 2017-06-26 DIAGNOSIS — Z66 Do not resuscitate: Secondary | ICD-10-CM | POA: Diagnosis present

## 2017-06-26 DIAGNOSIS — I25119 Atherosclerotic heart disease of native coronary artery with unspecified angina pectoris: Secondary | ICD-10-CM | POA: Diagnosis present

## 2017-06-26 DIAGNOSIS — R7989 Other specified abnormal findings of blood chemistry: Secondary | ICD-10-CM

## 2017-06-26 DIAGNOSIS — Z7189 Other specified counseling: Secondary | ICD-10-CM | POA: Diagnosis not present

## 2017-06-26 LAB — BASIC METABOLIC PANEL
ANION GAP: 10 (ref 5–15)
BUN: 13 mg/dL (ref 6–20)
CALCIUM: 8.8 mg/dL — AB (ref 8.9–10.3)
CHLORIDE: 104 mmol/L (ref 101–111)
CO2: 25 mmol/L (ref 22–32)
Creatinine, Ser: 0.91 mg/dL (ref 0.61–1.24)
GFR calc non Af Amer: >60 mL/min (ref >60)
Glucose, Bld: 112 mg/dL — ABNORMAL HIGH (ref 65–99)
POTASSIUM: 3.6 mmol/L (ref 3.5–5.1)
Sodium: 139 mmol/L (ref 135–145)

## 2017-06-26 LAB — TROPONIN I
TROPONIN I: 0.04 ng/mL — AB (ref <0.03)
Troponin I: 2.32 ng/mL (ref <0.03)
Troponin I: 2.31 ng/mL (ref <0.03)
Troponin I: 1.84 ng/mL (ref <0.03)

## 2017-06-26 LAB — CBC
HEMATOCRIT: 43.5 % (ref 40.0–52.0)
HEMOGLOBIN: 14.5 g/dL (ref 13.0–18.0)
MCH: 30.4 pg (ref 26.0–34.0)
MCHC: 33.3 g/dL (ref 32.0–36.0)
MCV: 91.1 fL (ref 80.0–100.0)
Platelets: 245 10*3/uL (ref 150–440)
RBC: 4.78 MIL/uL (ref 4.40–5.90)
RDW: 15.3 % — ABNORMAL HIGH (ref 11.5–14.5)
WBC: 8.7 10*3/uL (ref 3.8–10.6)

## 2017-06-26 LAB — TSH: TSH: 0.992 u[IU]/mL (ref 0.350–4.500)

## 2017-06-26 LAB — HEMOGLOBIN A1C
Hgb A1c MFr Bld: 6.1 % — ABNORMAL HIGH (ref 4.8–5.6)
Mean Plasma Glucose: 128.37 mg/dL

## 2017-06-26 MED ORDER — NITROGLYCERIN 0.4 MG SL SUBL
0.4000 mg | SUBLINGUAL_TABLET | Freq: Once | SUBLINGUAL | Status: AC
  Administered 2017-06-26: 0.4 mg via SUBLINGUAL

## 2017-06-26 MED ORDER — LISINOPRIL 5 MG PO TABS
2.5000 mg | ORAL_TABLET | Freq: Every day | ORAL | Status: DC
  Administered 2017-06-26 – 2017-06-27 (×2): 2.5 mg via ORAL
  Filled 2017-06-26 (×2): qty 1

## 2017-06-26 MED ORDER — ONDANSETRON HCL 4 MG PO TABS: 4.0000 mg | ORAL_TABLET | Freq: Four times a day (QID) | ORAL | Status: DC | PRN

## 2017-06-26 MED ORDER — ACETAMINOPHEN 325 MG PO TABS: 650.0000 mg | ORAL_TABLET | Freq: Four times a day (QID) | ORAL | Status: DC | PRN

## 2017-06-26 MED ORDER — METOPROLOL TARTRATE 25 MG PO TABS
25.0000 mg | ORAL_TABLET | Freq: Every day | ORAL | Status: DC
  Administered 2017-06-27: 25 mg via ORAL
  Filled 2017-06-26: qty 1

## 2017-06-26 MED ORDER — NITROGLYCERIN 0.4 MG SL SUBL
SUBLINGUAL_TABLET | SUBLINGUAL | Status: AC
  Filled 2017-06-26: qty 1

## 2017-06-26 MED ORDER — ONDANSETRON HCL 4 MG/2ML IJ SOLN: 4.0000 mg | Freq: Four times a day (QID) | INTRAMUSCULAR | Status: DC | PRN

## 2017-06-26 MED ORDER — METOPROLOL TARTRATE 25 MG PO TABS
12.5000 mg | ORAL_TABLET | Freq: Every day | ORAL | Status: DC
  Filled 2017-06-26: qty 1

## 2017-06-26 MED ORDER — ASPIRIN EC 81 MG PO TBEC
81.0000 mg | DELAYED_RELEASE_TABLET | Freq: Every day | ORAL | Status: DC
  Administered 2017-06-26: 81 mg via ORAL
  Filled 2017-06-26 (×2): qty 1

## 2017-06-26 MED ORDER — GABAPENTIN 100 MG PO CAPS
100.0000 mg | ORAL_CAPSULE | Freq: Three times a day (TID) | ORAL | Status: DC
  Administered 2017-06-26 – 2017-06-27 (×4): 100 mg via ORAL
  Filled 2017-06-26 (×4): qty 1

## 2017-06-26 MED ORDER — SODIUM CHLORIDE 0.9 % IV SOLN
INTRAVENOUS | Status: DC
  Administered 2017-06-26: 10:00:00 via INTRAVENOUS

## 2017-06-26 MED ORDER — TRAMADOL HCL 50 MG PO TABS: 50.0000 mg | ORAL_TABLET | Freq: Four times a day (QID) | ORAL | Status: DC | PRN

## 2017-06-26 MED ORDER — ATORVASTATIN CALCIUM 10 MG PO TABS
10.0000 mg | ORAL_TABLET | Freq: Every day | ORAL | Status: DC
  Administered 2017-06-26 – 2017-06-27 (×2): 10 mg via ORAL
  Filled 2017-06-26 (×2): qty 1

## 2017-06-26 MED ORDER — ENOXAPARIN SODIUM 40 MG/0.4ML ~~LOC~~ SOLN
40.0000 mg | SUBCUTANEOUS | Status: DC
  Administered 2017-06-26: 40 mg via SUBCUTANEOUS
  Filled 2017-06-26 (×2): qty 0.4

## 2017-06-26 MED ORDER — LEVOTHYROXINE SODIUM 100 MCG PO TABS
100.0000 ug | ORAL_TABLET | Freq: Every day | ORAL | Status: DC
  Administered 2017-06-27: 100 ug via ORAL
  Filled 2017-06-26: qty 1

## 2017-06-26 MED ORDER — ISOSORBIDE MONONITRATE ER 60 MG PO TB24
60.0000 mg | ORAL_TABLET | Freq: Every day | ORAL | Status: DC
  Administered 2017-06-27: 60 mg via ORAL
  Filled 2017-06-26: qty 1

## 2017-06-26 MED ORDER — CLOPIDOGREL BISULFATE 75 MG PO TABS
75.0000 mg | ORAL_TABLET | Freq: Every day | ORAL | Status: DC
  Administered 2017-06-26 – 2017-06-27 (×2): 75 mg via ORAL
  Filled 2017-06-26 (×2): qty 1

## 2017-06-26 MED ORDER — MAGNESIUM OXIDE 400 (241.3 MG) MG PO TABS
400.0000 mg | ORAL_TABLET | Freq: Every day | ORAL | Status: DC
  Administered 2017-06-26 – 2017-06-27 (×2): 400 mg via ORAL
  Filled 2017-06-26 (×2): qty 1

## 2017-06-26 MED ORDER — ISOSORBIDE MONONITRATE ER 30 MG PO TB24
30.0000 mg | ORAL_TABLET | Freq: Every day | ORAL | Status: DC
  Administered 2017-06-26: 30 mg via ORAL
  Filled 2017-06-26: qty 1

## 2017-06-26 MED ORDER — DOCUSATE SODIUM 100 MG PO CAPS
100.0000 mg | ORAL_CAPSULE | Freq: Two times a day (BID) | ORAL | Status: DC
  Administered 2017-06-26 – 2017-06-27 (×3): 100 mg via ORAL
  Filled 2017-06-26 (×3): qty 1

## 2017-06-26 MED ORDER — ACETAMINOPHEN 650 MG RE SUPP: 650.0000 mg | Freq: Four times a day (QID) | RECTAL | Status: DC | PRN

## 2017-06-26 MED ORDER — MORPHINE SULFATE (PF) 2 MG/ML IV SOLN: 2.0000 mg | INTRAVENOUS | Status: DC | PRN

## 2017-06-26 MED ORDER — PANTOPRAZOLE SODIUM 40 MG PO TBEC
40.0000 mg | DELAYED_RELEASE_TABLET | Freq: Every day | ORAL | Status: DC
  Administered 2017-06-26 – 2017-06-27 (×2): 40 mg via ORAL
  Filled 2017-06-26 (×2): qty 1

## 2017-06-26 NOTE — H&P (Signed)
Ryan Cantu is an 83 y.o. male.   Chief Complaint: Chest pain HPI: The patient with past medical history of coronary artery disease status post MI 2, hypertension, hypothyroidism and stroke presents to the emergency department complaining of chest pain. The patient's pain awoke him from sleep. It was centralized nonradiating. He became nauseous and had 1 episode of nonbloody nonbilious emesis. Notably the patient had an episode of nausea and vomiting earlier this morning that was not associated with chest pain or diaphoresis. EMS administered nitroglycerin spray which relieved the patient's pain from 10 out of 10 in severity to 2 out of 10. The patient has no pain at this time. Troponin was found to be mildly elevated and due to the patient's history of coronary artery disease emergency department staff called the hospitalist service for admission.  Past Medical History:  Diagnosis Date  . cva 2014, 10/30   Right-sided facial droop, and right extremities weakness.  . CVA (cerebrovascular accident due to intracerebral hemorrhage) ([place]) 2010  . GERD (gastroesophageal reflux disease)   . Hypertension   . Hypothyroidism   . MI (myocardial infarction) [facility])     Past Surgical History:  Procedure Laterality Date  . EYE SURGERY      Family History  Problem Relation Age of Onset  . Cancer Mother   . Cancer - Other Father   . Melanoma Brother   . Colon cancer Brother    Social History:  reports that he has never smoked. He has never used smokeless tobacco. He reports that he does not drink alcohol or use drugs.  Allergies:  Allergies  Allergen Reactions  . Sulfa Antibiotics Other (See Comments)    Reaction: Unknown     (Not in a hospital admission)  Results for orders placed or performed during the hospital encounter of 06/26/17 (from the past 48 hour(s))  Basic metabolic panel     Status: Abnormal   Collection Time: 06/26/17  2:53 AM  Result Value Ref Range   Sodium 139 135 - 145  mmol/L   Potassium 3.6 3.5 - 5.1 mmol/L   Chloride 104 101 - 111 mmol/L   CO2 25 22 - 32 mmol/L   Glucose, Bld 112 (H) 65 - 99 mg/dL   BUN 13 6 - 20 mg/dL   Creatinine, Ser 0.91 0.61 - 1.24 mg/dL   Calcium 8.8 (L) 8.9 - 10.3 mg/dL   GFR calc non Af Amer >60 >60 mL/min   GFR calc Af Amer >60 >60 mL/min    Comment: (NOTE) The eGFR has been calculated using the CKD EPI equation. This calculation has not been validated in all clinical situations. eGFR's persistently <60 mL/min signify possible Chronic Kidney Disease.    Anion gap 10 5 - 15  CBC     Status: Abnormal   Collection Time: 06/26/17  2:53 AM  Result Value Ref Range   WBC 8.7 3.8 - 10.6 K/uL   RBC 4.78 4.40 - 5.90 MIL/uL   Hemoglobin 14.5 13.0 - 18.0 g/dL   HCT 43.5 40.0 - 52.0 %   MCV 91.1 80.0 - 100.0 fL   MCH 30.4 26.0 - 34.0 pg   MCHC 33.3 32.0 - 36.0 g/dL   RDW 15.3 (H) 11.5 - 14.5 %   Platelets 245 150 - 440 K/uL  Troponin I     Status: Abnormal   Collection Time: 06/26/17  2:53 AM  Result Value Ref Range   Troponin I 0.04 (HH) <0.03 ng/mL    Comment:  CRITICAL RESULT CALLED TO, READ BACK BY AND VERIFIED WITH REBECCA UHORCHUK @ 0737 ON 06/26/2017 BY CAF    Dg Chest 2 View  Result Date: 06/26/2017 CLINICAL DATA:  Initial evaluation for acute chest pain. EXAM: CHEST  2 VIEW COMPARISON:  Prior radiograph from 01/29/2017. FINDINGS: Cardiac and mediastinal silhouettes are stable in size and contour, and remain within normal limits. Aortic atherosclerosis. Lungs hypoinflated. Mild left basilar atelectasis and/ or scarring. Chronic coarsening of the interstitial markings. No focal infiltrates. No pulmonary edema or pleural effusion. No pneumothorax. No acute osseus abnormality. IMPRESSION: 1. Mild left basilar atelectasis and/or scarring. 2. No other active cardiopulmonary disease. 3. Aortic atherosclerosis. Electronically Signed   By: Jeannine Boga M.D.   On: 06/26/2017 03:45    Review of Systems   Constitutional: Positive for diaphoresis (resolved). Negative for chills and fever.  HENT: Negative for sore throat and tinnitus.   Eyes: Negative for blurred vision and redness.  Respiratory: Negative for cough and shortness of breath.   Cardiovascular: Positive for chest pain (improved). Negative for palpitations, orthopnea and PND.  Gastrointestinal: Positive for nausea and vomiting. Negative for abdominal pain and diarrhea.  Genitourinary: Negative for dysuria, frequency and urgency.  Musculoskeletal: Negative for joint pain and myalgias.  Skin: Negative for rash.       No lesions  Neurological: Negative for speech change, focal weakness and weakness.  Endo/Heme/Allergies: Does not bruise/bleed easily.       No temperature intolerance  Psychiatric/Behavioral: Negative for depression and suicidal ideas.    Blood pressure (!) 161/90, pulse (!) 59, temperature 97.9 F (36.6 C), temperature source Oral, resp. rate 14, weight 92.1 kg (203 lb), SpO2 96 %. Physical Exam  Constitutional: He is oriented to person, place, and time. He appears well-developed and well-nourished. No distress.  HENT:  Head: Normocephalic and atraumatic.  Mouth/Throat: Oropharynx is clear and moist.  Eyes: Pupils are equal, round, and reactive to light. Conjunctivae and EOM are normal. No scleral icterus.  Neck: Normal range of motion. Neck supple. No JVD present. No tracheal deviation present. No thyromegaly present.  Cardiovascular: Normal rate and regular rhythm.  Exam reveals no gallop and no friction rub.   No murmur heard. Respiratory: Effort normal and breath sounds normal.  GI: Soft. Bowel sounds are normal. He exhibits no distension. There is no tenderness.  Genitourinary:  Genitourinary Comments: Deferred  Musculoskeletal: Normal range of motion. He exhibits no edema.  Lymphadenopathy:    He has no cervical adenopathy.  Neurological: He is alert and oriented to person, place, and time. No cranial  nerve deficit.  Skin: Skin is warm and dry. No rash noted. No erythema.  Psychiatric: He has a normal mood and affect. His behavior is normal. Judgment and thought content normal.     Assessment/Plan This is a 83 year old male admitted for chest pain. 1. Chest pain: The patient has mildly elevated troponin as well as symptomology characteristic of myocardial ischemia.  EKG is reassuring. Continue to monitor telemetry and follow cardiac biomarkers. Consult cardiology. 2. CAD: History of NSTEMI although no PCI due to preference for medical management. Continue aspirin. I have reviewed the patient's records and his cardiologist had discontinued Plavix 4 months ago. Due to recurrent chest pain I have restarted. If patient is a falls risk need to weigh benefit versus risk of ongoing antiplatelet therapy. Continue Imdur 3. Hypertension: Overall except for age although I will try to lower systolic pressure at this time to decrease myocardial oxygen demand. Continue  lisinopril and metoprolol.  4. Hypothyroidism: Check TSH. Continue Synthroid 5. Hyperlipidemia: Continue statin therapy 6. DVT prophylaxis: Lovenox 7. GI prophylaxis: PPI per home regimen The patient is a DO NOT RESUSCITATE. Time spent on admission orders and patient care approximately 45 minutes   Harrie Foreman, MD 06/26/2017, 6:48 AM

## 2017-06-26 NOTE — Progress Notes (Signed)
Dr Allena Katz was made aware of pt trop level 2.32, no new order , will continue to monitor

## 2017-06-26 NOTE — ED Notes (Signed)
Patient urinated in urinal 

## 2017-06-26 NOTE — ED Notes (Signed)
Date and time results received: 06/26/17 0423  Test: Troponin  Critical Value: 0.04  Name of Provider Notified: Dr. Manson Passey

## 2017-06-26 NOTE — Care Management (Signed)
Patient has been followed by [facility] home [facility] in the past.  Patient has ruled in for nstemi but will not seek any invasive evaluation.  Reached out to attending in regards to palliative consult for goals of care.  Patient is DNR. Three admissions in past six months.  May benefit from home palliative for symptom management/control.

## 2017-06-26 NOTE — ED Triage Notes (Signed)
Pt coming from home via EMS with chest pain that started at 1am. Diffuse with nausea and vomiting. Hx of 2 Heart attacks this year but no stints placed. Patient took 324 ASA at home. EMS gave 1 spray of nitro and 4 Mg of zofran and placed on 3L [place] since 02 was 90% on RA. Patient starts that his pain is now about a 2/10.

## 2017-06-26 NOTE — Consult Note (Addendum)
Reason for Consult: Chest pain non-STEMI angina Referring Physician: Dr. Emily Filbert primary, Dr. Rosilyn Mings hospitalist Cardiologist Dr. Lindley Magnus is an 83 y.o. male.  HPI: Patient's a 83 year old white male history of CVA peripheral vascular disease GERD hypertension hypothyroidism previous myocardial infarction patient was here in May. Patient was treated medically patient was at home and woke up with chest discomfort shortness of breath. He was brought in by rescue. Initial EKG and troponin were essentially unremarkable. Patient had elevated troponin up to 2.3  Past Medical History:  Diagnosis Date  . cva 2014, 10/30   Right-sided facial droop, and right extremities weakness.  . CVA (cerebrovascular accident due to intracerebral hemorrhage) ([place]) 2010  . GERD (gastroesophageal reflux disease)   . Hypertension   . Hypothyroidism   . MI (myocardial infarction) [facility])     Past Surgical History:  Procedure Laterality Date  . EYE SURGERY      Family History  Problem Relation Age of Onset  . Cancer Mother   . Cancer - Other Father   . Melanoma Brother   . Colon cancer Brother     Social History:  reports that he has never smoked. He has never used smokeless tobacco. He reports that he does not drink alcohol or use drugs.  Allergies:  Allergies  Allergen Reactions  . Sulfa Antibiotics Other (See Comments)    Reaction: Unknown    Medications: I have reviewed the patient's current medications.  Results for orders placed or performed during the hospital encounter of 06/26/17 (from the past 48 hour(s))  Basic metabolic panel     Status: Abnormal   Collection Time: 06/26/17  2:53 AM  Result Value Ref Range   Sodium 139 135 - 145 mmol/L   Potassium 3.6 3.5 - 5.1 mmol/L   Chloride 104 101 - 111 mmol/L   CO2 25 22 - 32 mmol/L   Glucose, Bld 112 (H) 65 - 99 mg/dL   BUN 13 6 - 20 mg/dL   Creatinine, Ser 0.91 0.61 - 1.24 mg/dL   Calcium 8.8 (L) 8.9 - 10.3 mg/dL    GFR calc non Af Amer >60 >60 mL/min   GFR calc Af Amer >60 >60 mL/min    Comment: (NOTE) The eGFR has been calculated using the CKD EPI equation. This calculation has not been validated in all clinical situations. eGFR's persistently <60 mL/min signify possible Chronic Kidney Disease.    Anion gap 10 5 - 15  CBC     Status: Abnormal   Collection Time: 06/26/17  2:53 AM  Result Value Ref Range   WBC 8.7 3.8 - 10.6 K/uL   RBC 4.78 4.40 - 5.90 MIL/uL   Hemoglobin 14.5 13.0 - 18.0 g/dL   HCT 43.5 40.0 - 52.0 %   MCV 91.1 80.0 - 100.0 fL   MCH 30.4 26.0 - 34.0 pg   MCHC 33.3 32.0 - 36.0 g/dL   RDW 15.3 (H) 11.5 - 14.5 %   Platelets 245 150 - 440 K/uL  Troponin I     Status: Abnormal   Collection Time: 06/26/17  2:53 AM  Result Value Ref Range   Troponin I 0.04 (HH) <0.03 ng/mL    Comment: CRITICAL RESULT CALLED TO, READ BACK BY AND VERIFIED WITH REBECCA UHORCHUK @ 9622 ON 06/26/2017 BY [facility]   TSH     Status: None   Collection Time: 06/26/17  9:13 AM  Result Value Ref Range   TSH 0.992 0.350 - 4.500 uIU/mL  Comment: Performed by a 3rd Generation assay with a functional sensitivity of <=0.01 uIU/mL.  Hemoglobin A1c     Status: Abnormal   Collection Time: 06/26/17  9:13 AM  Result Value Ref Range   Hgb A1c MFr Bld 6.1 (H) 4.8 - 5.6 %    Comment: (NOTE) Pre diabetes:          5.7%-6.4% Diabetes:              >6.4% Glycemic control for   <7.0% adults with diabetes    Mean Plasma Glucose 128.37 mg/dL    Comment: Performed at [facility] [address], [place], [place] [address]  Troponin I     Status: Abnormal   Collection Time: 06/26/17  9:13 AM  Result Value Ref Range   Troponin I 2.32 (HH) <0.03 ng/mL    Comment: CRITICAL RESULT CALLED TO, READ BACK BY AND VERIFIED WITH ABIGAIL JACKSON AT 1010 ON 06/26/17 [place].     Dg Chest 2 View  Result Date: 06/26/2017 CLINICAL DATA:  Initial evaluation for acute chest pain. EXAM: CHEST  2 VIEW COMPARISON:  Prior  radiograph from 01/29/2017. FINDINGS: Cardiac and mediastinal silhouettes are stable in size and contour, and remain within normal limits. Aortic atherosclerosis. Lungs hypoinflated. Mild left basilar atelectasis and/ or scarring. Chronic coarsening of the interstitial markings. No focal infiltrates. No pulmonary edema or pleural effusion. No pneumothorax. No acute osseus abnormality. IMPRESSION: 1. Mild left basilar atelectasis and/or scarring. 2. No other active cardiopulmonary disease. 3. Aortic atherosclerosis. Electronically Signed   By: Jeannine Boga M.D.   On: 06/26/2017 03:45    Review of Systems  Constitutional: Positive for diaphoresis and malaise/fatigue.  HENT: Positive for congestion and hearing loss.   Eyes: Negative.   Respiratory: Positive for shortness of breath.   Cardiovascular: Positive for chest pain and leg swelling.  Gastrointestinal: Positive for heartburn, nausea and vomiting.  Genitourinary: Negative.   Musculoskeletal: Positive for joint pain and neck pain.  Skin: Negative.   Neurological: Positive for dizziness and weakness.  Endo/Heme/Allergies: Negative.   Psychiatric/Behavioral: Negative.    Blood pressure (!) 146/75, pulse 62, temperature 97.7 F (36.5 C), temperature source Oral, resp. rate 16, height 5' 9"  (1.753 m), weight 186 lb 1.6 oz (84.4 kg), SpO2 92 %. Physical Exam  Nursing note and vitals reviewed. Constitutional: He is oriented to person, place, and time. He appears well-developed and well-nourished.  HENT:  Head: Normocephalic and atraumatic.  Eyes: Pupils are equal, round, and reactive to light. Conjunctivae and EOM are normal.  Neck: Normal range of motion. Neck supple.  Cardiovascular: Normal rate, regular rhythm and normal heart sounds.   Respiratory: Effort normal and breath sounds normal.  GI: Soft. Bowel sounds are normal.  Musculoskeletal: Normal range of motion.  Neurological: He is alert and oriented to person, place, and  time. He has normal reflexes.  Skin: Skin is warm and dry.  Psychiatric: He has a normal mood and affect.    Assessment/Plan: Non-STEMI Elevated troponin Chest pain Possible unstable angina Known coronary disease History of CVA GERD Hypertension . Plan Agree with admission to telemetry Follow-up cardiac enzymes and EKG Increase Imdur to 60 mg once a day Recommend advanced metoprolol to 25 mg once a day I do not recommend invasive strategy We will advance medications for medical management at this point If the patient is able to ambulate without symptoms would recommend discharged and follow-up with cardiology as an outpatient  Malissa Slay D Chemeka Filice 06/26/2017,  1:20 PM

## 2017-06-26 NOTE — ED Notes (Signed)
Resumed care from [place], [place]. Pt resting comfortably with family at bedside.

## 2017-06-26 NOTE — Progress Notes (Signed)
[facility] [place] at [facility]                                                                                                                                                                                  Patient Demographics   Ryan Cantu, is a 83 y.o. male, DOB - 05-08-1924, ZOX:[id number]  Admit date - 06/26/2017   Admitting Physician Arnaldo Natal, MD  Outpatient Primary MD for the patient is Danella Penton, MD   LOS - 0  Subjective: Patient admitted with chest pain now troponin elevated consistent with non-ST MI Patient currently not having any chest pain    Review of Systems:   CONSTITUTIONAL: No documented fever. No fatigue, weakness. No weight gain, no weight loss.  EYES: No blurry or double vision.  ENT: No tinnitus. No postnasal drip. No redness of the oropharynx.  RESPIRATORY: No cough, no wheeze, no hemoptysis. No dyspnea.  CARDIOVASCULAR: No chest pain. No orthopnea. No palpitations. No syncope.  GASTROINTESTINAL: No nausea, no vomiting or diarrhea. No abdominal pain. No melena or hematochezia.  GENITOURINARY: No dysuria or hematuria.  ENDOCRINE: No polyuria or nocturia. No heat or cold intolerance.  HEMATOLOGY: No anemia. No bruising. No bleeding.  INTEGUMENTARY: No rashes. No lesions.  MUSCULOSKELETAL: No arthritis. No swelling. No gout.  NEUROLOGIC: No numbness, tingling, or ataxia. No seizure-type activity.  PSYCHIATRIC: No anxiety. No insomnia. No ADD.    Vitals:   Vitals:   06/26/17 0630 06/26/17 0730 06/26/17 0826 06/26/17 1153  BP: (!) 161/90 (!) 152/81 (!) 159/75 (!) 146/75  Pulse: (!) 59 64 (!) 56 62  Resp: Temp:   97.7 F (36.5 C) 97.7 F (36.5 C)  TempSrc:   Oral Oral  SpO2: 96% 99% 95% 92%  Weight:   186 lb 1.6 oz (84.4 kg)   Height:    (1.753 m)     Wt Readings from Last 3 Encounters:  06/26/17 186 lb 1.6 oz (84.4 kg)  02/17/17 195 lb (88.5 kg)  01/30/17 191 lb 4.8 oz (86.8 kg)      Intake/Output Summary (Last 24 hours) at 06/26/17 1422 Last data filed at 06/26/17 1015  Gross per 24 hour  Intake               35 ml  Output              200 ml  Net             -165 ml    Physical Exam:   GENERAL: Pleasant-appearing in no apparent distress.  HEAD, EYES, EARS, NOSE AND THROAT: Atraumatic, normocephalic. Extraocular muscles are  intact. Pupils equal and reactive to light. Sclerae anicteric. No conjunctival injection. No oro-pharyngeal erythema.  NECK: Supple. There is no jugular venous distention. No bruits, no lymphadenopathy, no thyromegaly.  HEART: Regular rate and rhythm,. No murmurs, no rubs, no clicks.  LUNGS: Clear to auscultation bilaterally. No rales or rhonchi. No wheezes.  ABDOMEN: Soft, flat, nontender, nondistended. Has good bowel sounds. No hepatosplenomegaly appreciated.  EXTREMITIES: No evidence of any cyanosis, clubbing, or peripheral edema.  +2 pedal and radial pulses bilaterally.  NEUROLOGIC: The patient is alert, awake, and oriented x3 with no focal motor or sensory deficits appreciated bilaterally.  SKIN: Moist and warm with no rashes appreciated.  Psych: Not anxious, depressed LN: No inguinal LN enlargement    Antibiotics   Anti-infectives    None      Medications   Scheduled Meds: . aspirin EC  81 mg Oral Daily  . atorvastatin  10 mg Oral Daily  . clopidogrel  75 mg Oral Daily  . docusate sodium  100 mg Oral BID  . enoxaparin (LOVENOX) injection  40 mg Subcutaneous Q24H  . gabapentin  100 mg Oral TID  . [START ON 06/27/2017] isosorbide mononitrate  60 mg Oral Daily  . [START ON 06/27/2017] levothyroxine  100 mcg Oral QAC breakfast  . lisinopril  2.5 mg Oral Daily  . magnesium oxide  400 mg Oral Daily  . [START ON 06/27/2017] metoprolol tartrate  25 mg Oral Daily  . pantoprazole  40 mg Oral Daily   Continuous Infusions: PRN Meds:.acetaminophen **OR** acetaminophen, morphine injection, ondansetron **OR** ondansetron (ZOFRAN)  IV, traMADol   Data Review:   Micro Results No results found for this or any previous visit (from the past 240 hour(s)).  Radiology Reports Dg Chest 2 View  Result Date: 06/26/2017 CLINICAL DATA:  Initial evaluation for acute chest pain. EXAM: CHEST  2 VIEW COMPARISON:  Prior radiograph from 01/29/2017. FINDINGS: Cardiac and mediastinal silhouettes are stable in size and contour, and remain within normal limits. Aortic atherosclerosis. Lungs hypoinflated. Mild left basilar atelectasis and/ or scarring. Chronic coarsening of the interstitial markings. No focal infiltrates. No pulmonary edema or pleural effusion. No pneumothorax. No acute osseus abnormality. IMPRESSION: 1. Mild left basilar atelectasis and/or scarring. 2. No other active cardiopulmonary disease. 3. Aortic atherosclerosis. Electronically Signed   By: Rise Mu M.D.   On: 06/26/2017 03:45     CBC  Recent Labs Lab 06/26/17 0253  WBC 8.7  HGB 14.5  HCT 43.5  PLT 245  MCV 91.1  MCH 30.4  MCHC 33.3  RDW 15.3*    Chemistries   Recent Labs Lab 06/26/17 0253  NA 139  K 3.6  CL 104  CO2 25  GLUCOSE 112*  BUN 13  CREATININE 0.91  CALCIUM 8.8*   ------------------------------------------------------------------------------------------------------------------ estimated creatinine clearance is 52.9 mL/min (by C-G formula based on SCr of 0.91 mg/dL). ------------------------------------------------------------------------------------------------------------------  Recent Labs  06/26/17 0913  HGBA1C 6.1*   ------------------------------------------------------------------------------------------------------------------ No results for input(s): CHOL, HDL, LDLCALC, TRIG, CHOLHDL, LDLDIRECT in the last 72 hours. ------------------------------------------------------------------------------------------------------------------  Recent Labs  06/26/17 0913  TSH 0.992    ------------------------------------------------------------------------------------------------------------------ No results for input(s): VITAMINB12, FOLATE, FERRITIN, TIBC, IRON, RETICCTPCT in the last 72 hours.  Coagulation profile No results for input(s): INR, PROTIME in the last 168 hours.  No results for input(s): DDIMER in the last 72 hours.  Cardiac Enzymes  Recent Labs Lab 06/26/17 0253 06/26/17 0913  TROPONINI 0.04* 2.32*   ------------------------------------------------------------------------------------------------------------------ Invalid input(s): POCBNP    Assessment &  Plan   This is a 83 year old male admitted for chest pain. 1. Non-ST MI based on patient's advanced age cardiology does not feel invasive evaluation is the best option for medical management recommended  Continued therapy with him nor Metoprolol Aspirin and Plavix 2. Hypertension:  Continue lisinopril and metoprolol.  4. Hypothyroidism: Check TSH. Continue Synthroid 5. Hyperlipidemia: Continue statin therapy 6. DVT prophylaxis: Lovenox 7. GI prophylaxis: PPI per home regimen The patient is a DO NOT RESUSCITATE. Time spent on admission orders and patient care approximately 45 minutes     Code Status Orders        Start     Ordered   06/26/17 0912  Do not attempt resuscitation (DNR)  Continuous    Question Answer Comment  In the event of cardiac or respiratory ARREST Do not call a "code blue"   In the event of cardiac or respiratory ARREST Do not perform Intubation, CPR, defibrillation or ACLS   In the event of cardiac or respiratory ARREST Use medication by any route, position, wound care, and other measures to relive pain and suffering. May use oxygen, suction and manual treatment of airway obstruction as needed for comfort.      06/26/17 0911    Code Status History    Date Active Date Inactive Code Status Order ID Comments User Context   01/30/2017  6:17 AM 02/02/2017  5:08 PM  DNR [id number]  Ihor Austin, MD Inpatient   01/14/2017  2:17 PM 01/16/2017  6:29 PM DNR [id number]  Shaune Pollack, MD Inpatient   09/08/2016  3:20 PM 09/09/2016  5:29 PM Full Code [id number]  Hower, Cletis [place], MD ED   05/19/2015  2:56 AM 05/23/2015  8:14 PM Full Code [id number]  Crissie Figures, MD Inpatient           Consults  cardiology  DVT Prophylaxis  Lovenox  Lab Results  Component Value Date   PLT 245 06/26/2017     Time Spent in minutes   Greater than 50% of time spent in care coordination and counseling patient regarding the condition and plan of care.   Auburn Bilberry M.D on 06/26/2017 at 2:22 PM  Between 7am to 6pm - Pager - [phone]  After 6pm go to [email or web address] - password [email or web address] [facility]  [facility]   Office  [phone]

## 2017-06-26 NOTE — ED Provider Notes (Signed)
[facility] Emergency Department Provider Note   First MD Initiated Contact with Patient 06/26/17 0304     (approximate)  I have reviewed the triage vital signs and the nursing notes.   HISTORY  Chief Complaint Chest Pain   HPI Ryan Cantu is a 83 y.o. male with history of 2 myocardial infarctions  presents with intermittent central chest pain is nonradiating times one day. Patient denies any chest pain at present stating that it resolved following receiving nitroglycerin from EMS. Patient states that the pain can last from minutes to "a while". Patient was given 324 mg of aspirin and 1 sublingual nitroglycerin with complete resolution of pain before arrival to the emergency department   Past Medical History:  Diagnosis Date  . cva 2014, 10/30   Right-sided facial droop, and right extremities weakness.  . CVA (cerebrovascular accident due to intracerebral hemorrhage) (HCC) 2010  . GERD (gastroesophageal reflux disease)   . Hypertension   . Hypothyroidism   . MI (myocardial infarction) [facility])     Patient Active Problem List   Diagnosis Date Noted  . Chest pain   . DNR (do not resuscitate)   . Goals of care, counseling/discussion   . Non-STEMI (non-ST elevated myocardial infarction) (HCC) 01/30/2017  . Palliative care by specialist   . Advance care planning   . NSTEMI (non-ST elevated myocardial infarction) (HCC) 01/14/2017  . Acute posterior epistaxis 09/08/2016  . Weakness of right lower extremity 05/19/2015  . History of CVA (cerebrovascular accident) 05/19/2015  . Dizziness 07/30/2013  . GERD (gastroesophageal reflux disease) 07/30/2013  . CVA (cerebral vascular accident) (HCC) 07/23/2013  . Hypothyroidism 07/23/2013  . Essential hypertension 07/23/2013  . 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 EYE SURGERY      Prior to Admission medications   Medication Sig  Start Date End Date Taking? Authorizing Provider  aspirin EC 81 MG EC tablet Take 1 tablet (81 mg total) by mouth daily. 01/17/17  Yes Delfino Lovett, MD  atorvastatin (LIPITOR) 10 MG tablet Take 10 mg by mouth daily.   Yes [provider]  gabapentin (NEURONTIN) 100 MG capsule Take 100 mg by mouth 3 (three) times daily.   Yes [provider]  isosorbide mononitrate (IMDUR) 30 MG 24 hr tablet Take 1 tablet (30 mg total) by mouth daily. 01/17/17  Yes Delfino Lovett, MD  levothyroxine (SYNTHROID, LEVOTHROID) 100 MCG tablet Take 100 mcg by mouth daily before breakfast.   Yes [provider]  lisinopril (PRINIVIL,ZESTRIL) 2.5 MG tablet Take 1 tablet (2.5 mg total) by mouth daily. 02/03/17  Yes Altamese Dilling, MD  magnesium oxide (MAG-OX) 400 MG tablet Take 400 mg by mouth daily.   Yes [provider]  metoprolol tartrate (LOPRESSOR) 25 MG tablet Take 0.5 tablets (12.5 mg total) by mouth daily. 01/16/17  Yes Delfino Lovett, MD  Multiple Vitamins-Minerals (PRESERVISION AREDS PO) Take 1 tablet by mouth daily.   Yes [provider]  omeprazole (PRILOSEC) 20 MG capsule Take 20 mg by mouth daily.    Yes [provider]  traMADol (ULTRAM) 50 MG tablet Take 1 tablet (50 mg total) by mouth every 6 (six) hours as needed. 02/17/17 02/17/18 Yes Minna Antis, MD  atorvastatin (LIPITOR) 40 MG tablet Take 1 tablet (40 mg total) by mouth daily at 6 PM. Patient not taking: Reported on 06/26/2017 01/16/17   Delfino Lovett, MD  cephALEXin (KEFLEX) 500 MG capsule  Take 1 capsule (500 mg total) by mouth 2 (two) times daily. Patient not taking: Reported on 06/26/2017 02/17/17   Minna Antis, MD  clopidogrel (PLAVIX) 75 MG tablet Take 1 tablet (75 mg total) by mouth daily. Patient not taking: Reported on 06/26/2017 02/03/17   Altamese Dilling, MD  valACYclovir (VALTREX) 1000 MG tablet Take 1 tablet (1,000 mg total) by mouth 3 (three) times daily. Patient not taking:  Reported on 06/26/2017 02/17/17   Minna Antis, MD    Allergies Sulfa antibiotics  Family History  Problem Relation Age of Onset  . Cancer Mother   . Cancer - Other Father   . Melanoma Brother   . Colon cancer Brother     Social History Social History  Substance Use Topics  . Smoking status: Never Smoker  . Smokeless tobacco: Never Used  . Alcohol use No    Review of Systems Constitutional: No fever/chills Eyes: No visual changes. ENT: No sore throat. Cardiovascular: Denies chest pain. Respiratory: Denies shortness of breath. Gastrointestinal: No abdominal pain.  No nausea, no vomiting.  No diarrhea.  No constipation. Genitourinary: Negative for dysuria. Musculoskeletal: Negative for neck pain.  Negative for back pain. Integumentary: Negative for rash. Neurological: Negative for headaches, focal weakness or numbness.   ____________________________________________   PHYSICAL EXAM:  VITAL SIGNS: ED Triage Vitals  Enc Vitals Group     BP 06/26/17 0254 (!) 174/107     Pulse Rate 06/26/17 0254 86     Resp 06/26/17 0254 14     Temp 06/26/17 0254 97.9 F (36.6 C)     Temp Source 06/26/17 0254 Oral     SpO2 06/26/17 0254 94 %     Weight 06/26/17 0255 92.1 kg (203 lb)     Height --      Head Circumference --      Peak Flow --      Pain Score 06/26/17 0253 3     Pain Loc --      Pain Edu? --      Excl. in GC? --     Constitutional: Alert and oriented. Well appearing and in no acute distress. Eyes: Conjunctivae are normal. Head: Atraumatic. Mouth/Throat: Mucous membranes are moist.  Oropharynx non-erythematous. Neck: No stridor.   Cardiovascular: Normal rate, regular rhythm. Good peripheral circulation. Grossly normal heart sounds. Respiratory: Normal respiratory effort.  No retractions. Lungs CTAB. Gastrointestinal: Soft and nontender. No distention.  Musculoskeletal: No lower extremity tenderness nor edema. No gross deformities of  extremities. Neurologic:  Normal speech and language. No gross focal neurologic deficits are appreciated.  Skin:  Skin is warm, dry and intact. No rash noted. Psychiatric: Mood and affect are normal. Speech and behavior are normal.  ____________________________________________   LABS (all labs ordered are listed, but only abnormal results are displayed)  Labs Reviewed  BASIC METABOLIC PANEL - Abnormal; Notable for the following:       Result Value   Glucose, Bld 112 (*)    Calcium 8.8 (*)    All other components within normal limits  CBC - Abnormal; Notable for the following:    RDW 15.3 (*)    All other components within normal limits  TROPONIN I - Abnormal; Notable for the following:    Troponin I 0.04 (*)    All other components within normal limits   ____________________________________________  EKG  ED ECG REPORT I, [place] N BROWN, the attending physician, personally viewed and interpreted this ECG.   Date: 06/26/2017  EKG Time:  6:53 AM  Rate: 59  Rhythm: Sinus bradycardia  Axis: Normal  Intervals: Normal  ST&T Change: None  ____________________________________________  RADIOLOGY I,  N BROWN, personally viewed and evaluated these images (plain radiographs) as part of my medical decision making, as well as reviewing the written report by the radiologist.  Dg Chest 2 View  Result Date: 06/26/2017 CLINICAL DATA:  Initial evaluation for acute chest pain. EXAM: CHEST  2 VIEW COMPARISON:  Prior radiograph from 01/29/2017. FINDINGS: Cardiac and mediastinal silhouettes are stable in size and contour, and remain within normal limits. Aortic atherosclerosis. Lungs hypoinflated. Mild left basilar atelectasis and/ or scarring. Chronic coarsening of the interstitial markings. No focal infiltrates. No pulmonary edema or pleural effusion. No pneumothorax. No acute osseus abnormality. IMPRESSION: 1. Mild left basilar atelectasis and/or scarring. 2. No other active  cardiopulmonary disease. 3. Aortic atherosclerosis. Electronically Signed   By: Rise Mu M.D.   On: 06/26/2017 03:45     Procedures   ____________________________________________   INITIAL IMPRESSION / ASSESSMENT AND PLAN / ED COURSE  As part of my medical decision making, I reviewed the following data within the electronic MEDICAL RECORD NUMBER 83 year old male presenting to the emergency department with a [place] history and physical exam of chest pain that is intermittent. Concern for possible angina versus myocardial infarction. As such laboratory data was obtained including troponin which is elevated at 0.04. EKG revealed no evidence of ST segment elevation or depression. Patient discussed with Dr. Sheryle Hail for hospital admission for further evaluation and management including serial troponins        ____________________________________________  FINAL CLINICAL IMPRESSION(S) / ED DIAGNOSES  Final diagnoses:  Chest pain, unspecified type  Elevated troponin     MEDICATIONS GIVEN DURING THIS VISIT:  Medications  nitroGLYCERIN (NITROSTAT) SL tablet 0.4 mg (0.4 mg Sublingual Given 06/26/17 0306)     NEW OUTPATIENT MEDICATIONS STARTED DURING THIS VISIT:  New Prescriptions   No medications on file    Modified Medications   No medications on file    Discontinued Medications   No medications on file     Note:  This document was prepared using Dragon voice recognition software and may include unintentional dictation errors.    Darci Current, MD 06/26/17 [phone]

## 2017-06-27 DIAGNOSIS — I214 Non-ST elevation (NSTEMI) myocardial infarction: Principal | ICD-10-CM

## 2017-06-27 DIAGNOSIS — R079 Chest pain, unspecified: Secondary | ICD-10-CM

## 2017-06-27 DIAGNOSIS — R778 Other specified abnormalities of plasma proteins: Secondary | ICD-10-CM

## 2017-06-27 DIAGNOSIS — R748 Abnormal levels of other serum enzymes: Secondary | ICD-10-CM

## 2017-06-27 DIAGNOSIS — Z7189 Other specified counseling: Secondary | ICD-10-CM

## 2017-06-27 DIAGNOSIS — R7989 Other specified abnormal findings of blood chemistry: Secondary | ICD-10-CM

## 2017-06-27 DIAGNOSIS — Z515 Encounter for palliative care: Secondary | ICD-10-CM

## 2017-06-27 MED ORDER — ISOSORBIDE MONONITRATE ER 60 MG PO TB24: 60.0000 mg | ORAL_TABLET | Freq: Every day | ORAL | 1 refills | Status: AC

## 2017-06-27 MED ORDER — METOPROLOL TARTRATE 25 MG PO TABS: 25.0000 mg | ORAL_TABLET | Freq: Every day | ORAL | 1 refills | Status: AC

## 2017-06-27 NOTE — Evaluation (Signed)
Physical Therapy Evaluation Patient Details Name: Ryan Cantu MRN: [id number] DOB: 04/14/1924 Today's Date: 06/27/2017   History of Present Illness  Pt is a 83 y.o. male presenting to hospital with chest pain and found to have mild elevated troponin (now downtrending).  Pt with h/o 2 MI's.  Pt admitted with NSTEMI (no invasive evaluation planned).  Pt with h/o CVA (R sided facial droop with R extremity weakness), htn, NSTEMI.  Clinical Impression  Prior to hospital admission, pt was modified independent ambulating with RW.  Pt lives alone but has a friend and daughter that assist him as needed.  Currently pt is SBA with transfers and CGA to SBA with ambulation around nursing loop with RW.  Pt steady with use of RW during session and no c/o pain or any adverse symptom's.  Pt would benefit from skilled PT to address noted impairments and functional limitations (see below for any additional details) during hospital stay.  Pt appears close to baseline functional mobility.  Upon hospital discharge, recommend pt discharge to home with prior level of support from friends and family.    Follow Up Recommendations No PT follow up    Equipment Recommendations  Rolling walker with 5" wheels (pt already has RW)    Recommendations for Other Services       Precautions / Restrictions Precautions Precautions: Fall Restrictions Weight Bearing Restrictions: No      Mobility  Bed Mobility               General bed mobility comments: Deferred d/t pt sitting up in chair beginning and end of session.  Transfers Overall transfer level: Needs assistance Equipment used: Rolling walker (2 wheeled) Transfers: Sit to/from [employer] Sit to Stand: Supervision Stand pivot transfers: Supervision       General transfer comment: steady strong stand with RW; no vc's required  Ambulation/Gait Ambulation/Gait assistance: Min guard;Supervision Ambulation Distance (Feet): 240  Feet Assistive device: Rolling walker (2 wheeled) Gait Pattern/deviations: Step-through pattern Gait velocity: good cadence   General Gait Details: steady with RW; occasional vc's to stay closer to RW with distance  Stairs            Wheelchair Mobility    Modified Rankin (Stroke Patients Only)       Balance Overall balance assessment: Needs assistance Sitting-balance support: No upper extremity supported;Feet unsupported Sitting balance-Leahy Scale: Normal Sitting balance - Comments: sitting reaching outside BOS   Standing balance support: No upper extremity supported Standing balance-Leahy Scale: Good Standing balance comment: standing reaching outside BOS                             Pertinent Vitals/Pain Pain Assessment: No/denies pain  Vitals (HR and O2 on room air) stable and WFL throughout treatment session.    Home Living Family/patient expects to be discharged to:: Private residence Living Arrangements: Alone Available Help at Discharge: Friend(s);Family Type of Home: House Home Access: Stairs to enter Entrance Stairs-Rails: Left Entrance Stairs-Number of Steps: 3 Home Layout: One level;Able to live on main level with bedroom/bathroom (finished basement that pt goes to alot (flight of stairs with B railings); has RW at top and another at bottom of stairs) Home Equipment: Tub bench;Walker - 2 wheels;Wheelchair - manual      Prior Function Level of Independence: Needs assistance   Gait / Transfers Assistance Needed: Modified independent with transfers and ambulation using RW.  Denies any falls in past 6 months.  ADL's / Homemaking Assistance Needed: Receives MOW; friend assists with groceries and pt makes himself breakfast and dinner; goes out to eat with friend on weekends.  Comments: Pt does not drive but his friend or daughter takes him out as needed.  Active in general.  Daughter sets up home medications for 3 weeks at a time.     Hand  Dominance   Dominant Hand: Right    Extremity/Trunk Assessment   Upper Extremity Assessment Upper Extremity Assessment: Overall WFL for tasks assessed    Lower Extremity Assessment Lower Extremity Assessment: Overall WFL for tasks assessed    Cervical / Trunk Assessment Cervical / Trunk Assessment: Normal  Communication   Communication: HOH (Hears better out of R ear)  Cognition Arousal/Alertness: Awake/alert Behavior During Therapy: WFL for tasks assessed/performed Overall Cognitive Status: Within Functional Limits for tasks assessed                                        General Comments General comments (skin integrity, edema, etc.): Pt's friend present during session.  Nursing cleared pt for participation in physical therapy.  Pt agreeable to PT session.    Exercises     Assessment/Plan    PT Assessment Patient needs continued PT services  PT Problem List Decreased mobility;Decreased balance       PT Treatment Interventions DME instruction;Gait training;Stair training;Functional mobility training;Therapeutic activities;Therapeutic exercise;Balance training;Patient/family education    PT Goals (Current goals can be found in the Care Plan section)  Acute Rehab PT Goals Patient Stated Goal: to go home PT Goal Formulation: With patient Time For Goal Achievement: 07/11/17 Potential to Achieve Goals: Good    Frequency Min 2X/week   Barriers to discharge        Co-evaluation               AM-PAC PT "6 Clicks" Daily Activity  Outcome Measure Difficulty turning over in bed (including adjusting bedclothes, sheets and blankets)?: A Little Difficulty moving from lying on back to sitting on the side of the bed? : A Little Difficulty sitting down on and standing up from a chair with arms (e.g., wheelchair, bedside commode, etc,.)?: A Little Help needed moving to and from a bed to chair (including a wheelchair)?: A Little Help needed walking in  hospital room?: A Little Help needed climbing 3-5 steps with a railing? : A Little 6 Click Score: 18    End of Session Equipment Utilized During Treatment: Gait belt Activity Tolerance: Patient tolerated treatment well Patient left: in chair;with call bell/phone within reach;with chair alarm set;with family/visitor present Nurse Communication: Mobility status;Precautions PT Visit Diagnosis: Other abnormalities of gait and mobility (R26.89)    Time: 1038-1100 PT Time Calculation (min) (ACUTE ONLY): 22 min   Charges:   PT Evaluation $PT Eval Low Complexity: 1 Low     PT G Codes:   PT G-Codes **NOT FOR INPATIENT CLASS** Functional Assessment Tool Used: AM-PAC 6 Clicks Basic Mobility Functional Limitation: Mobility: Walking and moving around Mobility: Walking and Moving Around Current Status (G9562): At least 40 percent but less than 60 percent impaired, limited or restricted Mobility: Walking and Moving Around Goal Status [phone]): At least 1 percent but less than 20 percent impaired, limited or restricted    Hendricks Limes, PT 06/27/17, 12:35 PM [phone]

## 2017-06-27 NOTE — Progress Notes (Addendum)
New referral for Out Patient Palliative to follow at home received from Palliative Medicine NP Vennie Homans. Pateint is CURRENTLY followed by out Patient Palliative.  CMRN Ermalene Searing made aware. Updated patient information faxed to referral. Thank you. Dayna Barker RN,BSN, [facility], hospital liaison [phone] c

## 2017-06-27 NOTE — Consult Note (Signed)
Consultation Note Date: 06/27/2017   Patient Name: Ryan Cantu  DOB: 1924/06/17  MRN: [id number]  Age / Sex: 83 y.o., male  PCP: Ryan Aus, MD Referring Physician: Demetrios Loll, MD  Reason for Consultation: Establishing goals of care  HPI/Patient Profile: 83 y.o. male  with past medical history of myocardial infarction x2, CAD, hypothyroidism, hypertension, GERD, PVD, and CVA admitted on 06/26/2017 with chest pain and nausea. Recent hospitalization in May for myocardial infarction and treated with medical management. EMS administered nitroglycerin spray which relieved 10/10 chest pain to 2/10. Initial EKG and troponin were unremarkable. Troponin up to 2.3 during hospitalization. Trending down. Cardiology following and do not recommend invasive cardiac interventions. Continue medical management with outpatient cardiology follow-up. Palliative medicine consultation for goals of care.   Clinical Assessment and Goals of Care: I have reviewed medical records, discussed with care team, and met with patient and good friend Ryan Cantu) at bedside to discuss diagnosis, Ryan Cantu, EOL wishes, disposition and options. Patient known to PMT from previous admissions. Ryan Cantu is sitting in the chair comfortably. He denies pain.   Introduced Palliative Medicine as specialized medical care for people living with serious illness. It focuses on providing relief from the symptoms and stress of a serious illness. The goal is to improve Cantu of life for both the patient and the family.  We discussed a brief life review of the patient. Lives home alone and independent with ADL's. Two supportive daughters. Ryan Cantu is a neighbor that checks in on him often. After previous hospitalization for MI, patient was discharged to rehab but able to return home at previous baseline.  Discussed hospital diagnoses and interventions. Ryan Cantu confirms  that he is being treated with medications. He does not want to pursue aggressive cardiac workup. Him and Ryan Cantu understand risk for continued chest pain secondary to CAD and risk for sudden cardiac event.   Ryan Cantu has a documented living will and HCPOA that I reviewed in Epic. Ryan Cantu (his deceased wife) was primary 18. His daughters Ryan Cantu and Ryan Cantu are also on his living will as HCPOA's. He has spoke clearly of his wishes for DNR and not prolonging his life if he was terminal.   I attempted to elicit values and goals of care important to the patient. It remains important for Ryan Cantu to stay in his home, which he has lived in since 6's. He is agreeable with home health/physical therapy on discharge. Educated on palliative services to follow on discharge as an extra layer of support, continued [place] conversations, and symptom management. He is agreeable and eager to discharge today.     SUMMARY OF RECOMMENDATIONS    DNR  Advanced directive scanned into EPIC. Patient has HCPOA and EOL wishes documented.   Medical management with outpatient cardiology follow-up.  Patient agreeable with outpatient palliative services. Possible discharge today.  Code Status/Advance Care Planning:  DNR  Symptom Management:   Per attending  Palliative Prophylaxis:   Aspiration, Bowel Regimen, Delirium Protocol and Frequent Pain Assessment  Additional Recommendations (  Limitations, Scope, Preferences):  DNR. Medical management for NSTEMI/CAD.  Psycho-social/Spiritual:   Desire for further Chaplaincy support: no  Additional Recommendations: Caregiving  Support/Resources  Prognosis:   Unable to determine  Discharge Planning: Home with Home Health palliative services to follow outpatient.       Primary Diagnoses: Present on Admission: . Chest pain . NSTEMI (non-ST elevated myocardial infarction) ([place])   I have reviewed the medical record, interviewed the patient and family, and  examined the patient. The following aspects are pertinent.  Past Medical History:  Diagnosis Date  . cva 2014, 10/30   Right-sided facial droop, and right extremities weakness.  . CVA (cerebrovascular accident due to intracerebral hemorrhage) ([place]) 2010  . GERD (gastroesophageal reflux disease)   . Hypertension   . Hypothyroidism   . MI (myocardial infarction) [facility])    Social History   Social History  . Marital status: Widowed    Spouse name: widowed  . Number of children: N/A  . Years of education: N/A   Occupational History  . Clinical research associate     Retired   Social History Main Topics  . Smoking status: Never Smoker  . Smokeless tobacco: Never Used  . Alcohol use No  . Drug use: No  . Sexual activity: Not Currently   Other Topics Concern  . None   Social History Narrative  . None   Family History  Problem Relation Age of Onset  . Cancer Mother   . Cancer - Other Father   . Melanoma Brother   . Colon cancer Brother    Scheduled Meds: . aspirin EC  81 mg Oral Daily  . atorvastatin  10 mg Oral Daily  . clopidogrel  75 mg Oral Daily  . docusate sodium  100 mg Oral BID  . enoxaparin (LOVENOX) injection  40 mg Subcutaneous Q24H  . gabapentin  100 mg Oral TID  . isosorbide mononitrate  60 mg Oral Daily  . levothyroxine  100 mcg Oral QAC breakfast  . lisinopril  2.5 mg Oral Daily  . magnesium oxide  400 mg Oral Daily  . metoprolol tartrate  25 mg Oral Daily  . pantoprazole  40 mg Oral Daily   Continuous Infusions: PRN Meds:.acetaminophen **OR** acetaminophen, morphine injection, ondansetron **OR** ondansetron (ZOFRAN) IV, traMADol Medications Prior to Admission:  Prior to Admission medications   Medication Sig Start Date End Date Taking? Authorizing Provider  aspirin EC 81 MG EC tablet Take 1 tablet (81 mg total) by mouth daily. 01/17/17  Yes Max Sane, MD  atorvastatin (LIPITOR) 10 MG tablet Take 10 mg by mouth daily.   Yes [provider]  gabapentin  (NEURONTIN) 100 MG capsule Take 100 mg by mouth 3 (three) times daily.   Yes [provider]  isosorbide mononitrate (IMDUR) 30 MG 24 hr tablet Take 1 tablet (30 mg total) by mouth daily. 01/17/17  Yes Max Sane, MD  levothyroxine (SYNTHROID, LEVOTHROID) 100 MCG tablet Take 100 mcg by mouth daily before breakfast.   Yes [provider]  lisinopril (PRINIVIL,ZESTRIL) 2.5 MG tablet Take 1 tablet (2.5 mg total) by mouth daily. 02/03/17  Yes Vaughan Basta, MD  magnesium oxide (MAG-OX) 400 MG tablet Take 400 mg by mouth daily.   Yes [provider]  metoprolol tartrate (LOPRESSOR) 25 MG tablet Take 0.5 tablets (12.5 mg total) by mouth daily. 01/16/17  Yes Max Sane, MD  Multiple Vitamins-Minerals (PRESERVISION AREDS PO) Take 1 tablet by mouth daily.   Yes [provider]  omeprazole (PRILOSEC) 20 MG capsule Take 20 mg by mouth daily.    Yes [provider]  traMADol (ULTRAM) 50 MG tablet Take 1 tablet (50 mg total) by mouth every 6 (six) hours as needed. 02/17/17 02/17/18 Yes Harvest Dark, MD  atorvastatin (LIPITOR) 40 MG tablet Take 1 tablet (40 mg total) by mouth daily at 6 PM. Patient not taking: Reported on 06/26/2017 01/16/17   Max Sane, MD  cephALEXin (KEFLEX) 500 MG capsule Take 1 capsule (500 mg total) by mouth 2 (two) times daily. Patient not taking: Reported on 06/26/2017 02/17/17   Harvest Dark, MD  clopidogrel (PLAVIX) 75 MG tablet Take 1 tablet (75 mg total) by mouth daily. Patient not taking: Reported on 06/26/2017 02/03/17   Vaughan Basta, MD  isosorbide mononitrate (IMDUR) 60 MG 24 hr tablet Take 1 tablet (60 mg total) by mouth daily. 06/27/17   Ryan Loll, MD  metoprolol tartrate (LOPRESSOR) 25 MG tablet Take 1 tablet (25 mg total) by mouth daily. 06/27/17   Ryan Loll, MD  valACYclovir (VALTREX) 1000 MG tablet Take 1 tablet (1,000 mg total) by mouth 3 (three) times daily. Patient not taking: Reported on 06/26/2017  02/17/17   Harvest Dark, MD   Allergies  Allergen Reactions  . Sulfa Antibiotics Other (See Comments)    Reaction: Unknown   Review of Systems  Respiratory: Negative for chest tightness and shortness of breath.   Cardiovascular: Negative for chest pain.  Gastrointestinal: Negative for nausea.  All other systems reviewed and are negative.  Physical Exam  Constitutional: He is oriented to person, place, and time. He is cooperative.  HENT:  Head: Normocephalic and atraumatic.  Cardiovascular: Regular rhythm.   Pulmonary/Chest: Effort normal.  Neurological: He is alert and oriented to person, place, and time.  Skin: Skin is warm and dry. There is pallor.  Psychiatric: He has a normal mood and affect. His speech is normal and behavior is normal. Cognition and memory are normal.  Nursing note and vitals reviewed.  Vital Signs: BP 127/76 (BP Location: Right Arm)   Pulse 63   Temp 98 F (36.7 C) (Oral)   Resp 18   Ht _0  (1.753 m)   Wt 84.4 kg (186 lb)   SpO2 95%   BMI 27.47 kg/m  Pain Assessment: No/denies pain POSS *See Group Information*: 1-Acceptable,Awake and alert Pain Score: 0-No pain   SpO2: SpO2: 95 % O2 Device:SpO2: 95 % O2 Flow Rate: .O2 Flow Rate (L/min): 3 L/min  IO: Intake/output summary:   Intake/Output Summary (Last 24 hours) at 06/27/17 1025 Last data filed at 06/27/17 1019  Gross per 24 hour  Intake              720 ml  Output                0 ml  Net              720 ml    LBM: Last BM Date: 06/25/17 Baseline Weight: Weight: 92.1 kg (203 lb) Most recent weight: Weight: 84.4 kg (186 lb)     Palliative Assessment/Data: PPS 60%   Flowsheet Rows     Most Recent Value  Intake Tab  Referral Department  Hospitalist  Unit at Time of Referral  Cardiac/Telemetry Unit  Palliative Care Primary Diagnosis  Cardiac  Palliative Care Type  Return patient Palliative Care  Reason for referral  Clarify Goals of Care  Date first seen by Palliative Care   06/27/17  Clinical  Assessment  Palliative Performance Scale Score  60%  Psychosocial & Spiritual Assessment  Palliative Care Outcomes  Patient/Family meeting held?  Yes  Who was at the meeting?  patient and friend Ryan Cantu)  [place] goals of care, Provided psychosocial or spiritual support, ACP counseling assistance, Linked to palliative care logitudinal support      Time In: 0930 Time Out: 1030 Time Total: 70mn Greater than 50%  of this time was spent counseling and coordinating care related to the above assessment and plan.  Signed by:  MIhor Dow FNP-C Palliative Medicine Team  Phone: [phone]: [phone]  Please contact Palliative Medicine Team phone at [phone] questions and concerns.  For individual provider: See AShea Evans

## 2017-06-27 NOTE — Discharge Instructions (Signed)
Heart healthy diet. HHPT Fall precaution. Palliative care follow up.

## 2017-06-27 NOTE — Progress Notes (Signed)
Went over discharge instructions with medications and follow-up appointment with the patient and daughter. Discontinue peripheral IV and telemetry monitor by Darl Pikes, RN. Candace will help patient transport.

## 2017-06-27 NOTE — Discharge Summary (Signed)
[facility] -  at [facility]   PATIENT NAME: Ryan Cantu    MR#:  [id number]  DATE OF BIRTH:  1924/06/05  DATE OF ADMISSION:  06/26/2017   ADMITTING PHYSICIAN: Arnaldo Natal, MD  DATE OF DISCHARGE: 06/27/2017 PRIMARY CARE PHYSICIAN: Danella Penton, MD   ADMISSION DIAGNOSIS:  Elevated troponin [R74.8] Chest pain, unspecified type [R07.9] DISCHARGE DIAGNOSIS:  Active Problems:   NSTEMI (non-ST elevated myocardial infarction) [facility])   Chest pain  SECONDARY DIAGNOSIS:   Past Medical History:  Diagnosis Date  . cva 2014, 10/30   Right-sided facial droop, and right extremities weakness.  . CVA (cerebrovascular accident due to intracerebral hemorrhage) ([facility]) 2010  . GERD (gastroesophageal reflux disease)   . Hypertension   . Hypothyroidism   . MI (myocardial infarction) [facility])    HOSPITAL COURSE:   This is a 83 year old male admitted for chest pain. 1. Non-ST MI based on patient's advanced age cardiology does not feel invasive evaluation is the best option for medical management recommended.  Continued therapy with lisinopril and Metoprolol Aspirin and Plavix 2. Hypertension:  Continuelisinopril and metoprolol.  4. Hypothyroidism: Continue Synthroid 5. Hyperlipidemia: Continue statin therapy   DISCHARGE CONDITIONS:  Stable, discharge to home with [facility]. CONSULTS OBTAINED:  Treatment Team:  Alwyn Pea, MD DRUG ALLERGIES:   Allergies  Allergen Reactions  . Sulfa Antibiotics Other (See Comments)    Reaction: Unknown   DISCHARGE MEDICATIONS:   Allergies as of 06/27/2017      Reactions   Sulfa Antibiotics Other (See Comments)   Reaction: Unknown      Medication List    STOP taking these medications   cephALEXin 500 MG capsule Commonly known as:  KEFLEX   traMADol 50 MG tablet Commonly known as:  ULTRAM   valACYclovir 1000 MG tablet Commonly known as:  VALTREX     TAKE these medications   aspirin 81 MG EC  tablet Take 1 tablet (81 mg total) by mouth daily.   atorvastatin 10 MG tablet Commonly known as:  LIPITOR Take 10 mg by mouth daily. What changed:  Another medication with the same name was removed. Continue taking this medication, and follow the directions you see here.   clopidogrel 75 MG tablet Commonly known as:  PLAVIX Take 1 tablet (75 mg total) by mouth daily.   gabapentin 100 MG capsule Commonly known as:  NEURONTIN Take 100 mg by mouth 3 (three) times daily.   isosorbide mononitrate 60 MG 24 hr tablet Commonly known as:  IMDUR Take 1 tablet (60 mg total) by mouth daily. What changed:  medication strength  how much to take   levothyroxine 100 MCG tablet Commonly known as:  SYNTHROID, LEVOTHROID Take 100 mcg by mouth daily before breakfast.   lisinopril 2.5 MG tablet Commonly known as:  PRINIVIL,ZESTRIL Take 1 tablet (2.5 mg total) by mouth daily.   magnesium oxide 400 MG tablet Commonly known as:  MAG-OX Take 400 mg by mouth daily.   metoprolol tartrate 25 MG tablet Commonly known as:  LOPRESSOR Take 1 tablet (25 mg total) by mouth daily. What changed:  how much to take   omeprazole 20 MG capsule Commonly known as:  PRILOSEC Take 20 mg by mouth daily.   PRESERVISION AREDS PO Take 1 tablet by mouth daily.        DISCHARGE INSTRUCTIONS:  See AVS.  If you experience worsening of your admission symptoms, develop shortness of breath, life threatening emergency, suicidal or homicidal  thoughts you must seek medical attention immediately by calling 911 or calling your MD immediately  if symptoms less severe.  You Must read complete instructions/literature along with all the possible adverse reactions/side effects for all the Medicines you take and that have been prescribed to you. Take any new Medicines after you have completely understood and accpet all the possible adverse reactions/side effects.   Please note  You were cared for by a hospitalist  during your hospital stay. If you have any questions about your discharge medications or the care you received while you were in the hospital after you are discharged, you can call the unit and asked to speak with the hospitalist on call if the hospitalist that took care of you is not available. Once you are discharged, your primary care physician will handle any further medical issues. Please note that NO REFILLS for any discharge medications will be authorized once you are discharged, as it is imperative that you return to your primary care physician (or establish a relationship with a primary care physician if you do not have one) for your aftercare needs so that they can reassess your need for medications and monitor your lab values.    On the day of Discharge:  VITAL SIGNS:  Blood pressure 127/76, pulse 63, temperature 98 F (36.7 C), temperature source Oral, resp. rate 18, height  (1.753 m), weight 186 lb (84.4 kg), SpO2 95 %. PHYSICAL EXAMINATION:  GENERAL:  83 y.o.-year-old patient lying in the bed with no acute distress.  EYES: Pupils equal, round, reactive to light and accommodation. No scleral icterus. Extraocular muscles intact.  HEENT: Head atraumatic, normocephalic. Oropharynx and nasopharynx clear.  NECK:  Supple, no jugular venous distention. No thyroid enlargement, no tenderness.  LUNGS: Normal breath sounds bilaterally, no wheezing, rales,rhonchi or crepitation. No use of accessory muscles of respiration.  CARDIOVASCULAR: S1, S2 normal. No murmurs, rubs, or gallops.  ABDOMEN: Soft, non-tender, non-distended. Bowel sounds present. No organomegaly or mass.  EXTREMITIES: No pedal edema, cyanosis, or clubbing.  NEUROLOGIC: Cranial nerves II through XII are intact. Muscle strength 5/5 in all extremities. Sensation intact. Gait not checked.  PSYCHIATRIC: The patient is alert and oriented x 3.  SKIN: No obvious rash, lesion, or ulcer.  DATA REVIEW:   CBC  Recent Labs Lab  06/26/17 0253  WBC 8.7  HGB 14.5  HCT 43.5  PLT 245    Chemistries   Recent Labs Lab 06/26/17 0253  NA 139  K 3.6  CL 104  CO2 25  GLUCOSE 112*  BUN 13  CREATININE 0.91  CALCIUM 8.8*     Microbiology Results  Results for orders placed or performed during the hospital encounter of 04/30/09  Urine culture     Status: None   Collection Time: 05/10/09  7:19 AM  Result Value Ref Range Status   Specimen Description URINE, CATHETERIZED  Final   Special Requests ADDED 0950  Final   Colony Count 85,000 COLONIES/ML  Final   Culture ENTEROCOCCUS SPECIES  Final   Report Status 05/13/2009 FINAL  Final   Organism ID, Bacteria ENTEROCOCCUS SPECIES  Final      Susceptibility   Enterococcus species - MIC    AMPICILLIN <=2 Sensitive     LEVOFLOXACIN >=8 Resistant     NITROFURANTOIN <=16 Sensitive     VANCOMYCIN 1 Sensitive   Culture, blood (routine x 2)     Status: None   Collection Time: 05/10/09  7:34 AM  Result Value Ref  Range Status   Specimen Description BLOOD LEFT ARM  Final   Special Requests   Final    BOTTLES DRAWN AEROBIC AND ANAEROBIC 10CC  PT ON ROCEPHIN   Culture   Final    ENTEROCOCCUS SPECIES Note: COMBINATION THERAPY OF HIGH DOSE AMPICILLIN OR VANCOMYCIN, PLUS AN AMINOGLYCOSIDE, IS USUALLY INDICATED FOR SERIOUS ENTEROCOCCAL INFECTIONS. Note: Gram Stain Report Called to,Read Back By and Verified With:  MARK FIBINGE @ [phone] ON [phone] M CYCOTTE   Report Status 05/13/2009 FINAL  Final   Organism ID, Bacteria ENTEROCOCCUS SPECIES  Final      Susceptibility   Enterococcus species - MIC    AMPICILLIN <=2 Sensitive     VANCOMYCIN 1 Sensitive     GENTAMICIN SYNERGY SENSITIVE    Culture, blood (routine x 2)     Status: None   Collection Time: 05/10/09  7:40 AM  Result Value Ref Range Status   Specimen Description BLOOD LEFT HAND  Final   Special Requests   Final    BOTTLES DRAWN AEROBIC AND ANAEROBIC 10CC  PT ON ROCEPHIN   Culture   Final    ENTEROCOCCUS  SPECIES Note: SUSCEPTIBILITIES PERFORMED ON PREVIOUS CULTURE WITHIN THE LAST 5 DAYS. 25 Note: Gram Stain Report Called to,Read Back By and Verified With: KELSEY LEONARD @ 0124 ON 8 10 N WILSON   Report Status 05/13/2009 FINAL  Final  Culture, sputum-assessment     Status: None   Collection Time: 05/10/09  1:59 PM  Result Value Ref Range Status   Specimen Description SPUTUM  Final   Special Requests NONE  Final   Sputum evaluation   Final    MICROSCOPIC FINDINGS SUGGEST THAT THIS SPECIMEN IS NOT REPRESENTATIVE OF LOWER RESPIRATORY SECRETIONS. PLEASE RECOLLECT. CALLED TO J.WOODY,RN 05/10/09 1425 BY BSLADE   Report Status 05/10/2009 FINAL  Final  Culture, respiratory     Status: None   Collection Time: 05/10/09  4:16 PM  Result Value Ref Range Status   Specimen Description ENDOTRACHEAL ASPIRATE  Final   Special Requests NONE  Final   Gram Stain   Final    MODERATE WBC PRESENT,BOTH PMN AND MONONUCLEAR FEW SQUAMOUS EPITHELIAL CELLS PRESENT FEW GRAM POSITIVE RODS FEW YEAST   Culture MODERATE CANDIDA ALBICANS  Final   Report Status 05/13/2009 FINAL  Final  CSF cell count with differential     Status: Abnormal   Collection Time: 05/10/09  4:24 PM  Result Value Ref Range Status   Tube # 3  Final   Color, CSF RED (A) COLORLESS Final   Appearance, CSF BLOODY (A) CLEAR Final   Supernatant XANTHOCHROMIC  Final   RBC Count, CSF 20025 (H) 0 /cu mm Final   WBC, CSF 3 0 - 5 /cu mm Final   Segmented Neutrophils-CSF RARE 0 - 6 % Final   Lymphs, CSF FEW 40 - 80 % Final   Monocyte-Macrophage-Spinal Fluid OCCASIONAL 15 - 45 % Final   Other Cells, CSF TOO FEW TO COUNT, SMEAR AVAILABLE FOR REVIEW  Final  Protein and glucose, CSF     Status: Abnormal   Collection Time: 05/10/09  4:24 PM  Result Value Ref Range Status   Glucose, CSF 44 43 - 76 mg/dL Final   Total  Protein, CSF 268 RESULTS CONFIRMED BY MANUAL DILUTION (H) 15 - 45 mg/dL Final  AFB culture     Status: None   Collection Time: 05/10/09   5:00 PM  Result Value Ref Range Status   Specimen Description  CSF  Final   Special Requests NONE  Final   Acid Fast Smear NO ACID FAST BACILLI SEEN  Final   Culture NO ACID FAST BACILLI ISOLATED IN 6 WEEKS  Final   Report Status 06/21/2009 FINAL  Final  Fungus culture     Status: None   Collection Time: 05/10/09  5:00 PM  Result Value Ref Range Status   Specimen Description CSF  Final   Special Requests NONE  Final   Fungal Smear NO YEAST OR FUNGAL ELEMENTS SEEN  Final   Culture No Fungi Isolated in 4 Weeks  Final   Report Status 06/13/2009 FINAL  Final  CSF culture     Status: None   Collection Time: 05/10/09  5:00 PM  Result Value Ref Range Status   Specimen Description CSF  Final   Special Requests IMMUNE:NORM  Final   Gram Stain CYTOSPIN NO WBC SEEN NO ORGANISMS SEEN  Final   Culture NO GROWTH 3 DAYS  Final   Report Status 05/14/2009 FINAL  Final  Urine culture     Status: None   Collection Time: 05/14/09 11:16 AM  Result Value Ref Range Status   Specimen Description URINE, RANDOM  Final   Special Requests NONE  Final   Colony Count NO GROWTH  Final   Culture NO GROWTH  Final   Report Status 05/15/2009 FINAL  Final    RADIOLOGY:  No results found.   Management plans discussed with the patient, family and they are in agreement.  CODE STATUS: DNR   TOTAL TIME TAKING CARE OF THIS PATIENT: 32 minutes.    Shaune Pollack M.D on 06/27/2017 at 1:18 PM  Between 7am to 6pm - Pager - [phone]  After 6pm go to [email or web address] - Social research officer, government  [employer] [facility]  Hospitalists  Office  [phone]  CC: Primary care physician; Danella Penton, MD   Note: This dictation was prepared with Dragon dictation along with smaller phrase technology. Any transcriptional errors that result from this process are unintentional.

## 2018-03-20 ENCOUNTER — Other Ambulatory Visit: Payer: Self-pay

## 2018-03-20 ENCOUNTER — Emergency Department: Payer: Medicare Other

## 2018-03-20 ENCOUNTER — Emergency Department
Admission: EM | Admit: 2018-03-20 | Discharge: 2018-03-20 | Disposition: A | Discharge status: Home / Self Care | Payer: Medicare Other | Attending: Emergency Medicine | Admitting: Emergency Medicine

## 2018-03-20 DIAGNOSIS — Z7902 Long term (current) use of antithrombotics/antiplatelets: Secondary | ICD-10-CM | POA: Diagnosis not present

## 2018-03-20 DIAGNOSIS — Z7982 Long term (current) use of aspirin: Secondary | ICD-10-CM | POA: Insufficient documentation

## 2018-03-20 DIAGNOSIS — I10 Essential (primary) hypertension: Secondary | ICD-10-CM | POA: Insufficient documentation

## 2018-03-20 DIAGNOSIS — M545 Low back pain, unspecified: Secondary | ICD-10-CM

## 2018-03-20 DIAGNOSIS — Z79899 Other long term (current) drug therapy: Secondary | ICD-10-CM | POA: Diagnosis not present

## 2018-03-20 DIAGNOSIS — E039 Hypothyroidism, unspecified: Secondary | ICD-10-CM | POA: Insufficient documentation

## 2018-03-20 DIAGNOSIS — Z8673 Personal history of transient ischemic attack (TIA), and cerebral infarction without residual deficits: Secondary | ICD-10-CM | POA: Diagnosis not present

## 2018-03-20 DIAGNOSIS — R1031 Right lower quadrant pain: Secondary | ICD-10-CM | POA: Insufficient documentation

## 2018-03-20 DIAGNOSIS — I252 Old myocardial infarction: Secondary | ICD-10-CM | POA: Insufficient documentation

## 2018-03-20 DIAGNOSIS — Z66 Do not resuscitate: Secondary | ICD-10-CM | POA: Diagnosis not present

## 2018-03-20 LAB — COMPREHENSIVE METABOLIC PANEL
ALBUMIN: 3.9 g/dL (ref 3.5–5.0)
ALT: 10 U/L (ref 0–44)
AST: 27 U/L (ref 15–41)
Alkaline Phosphatase: 83 U/L (ref 38–126)
Anion gap: 8 (ref 5–15)
BUN: 14 mg/dL (ref 8–23)
CHLORIDE: 105 mmol/L (ref 98–111)
CO2: 27 mmol/L (ref 22–32)
Calcium: 8.9 mg/dL (ref 8.9–10.3)
Creatinine, Ser: 0.91 mg/dL (ref 0.61–1.24)
GFR calc Af Amer: >60 mL/min (ref >60)
GFR calc non Af Amer: >60 mL/min (ref >60)
GLUCOSE: 145 mg/dL — AB (ref 70–99)
POTASSIUM: 4 mmol/L (ref 3.5–5.1)
Sodium: 140 mmol/L (ref 135–145)
Total Bilirubin: 1.1 mg/dL (ref 0.3–1.2)
Total Protein: 6.9 g/dL (ref 6.5–8.1)

## 2018-03-20 LAB — URINALYSIS, COMPLETE (UACMP) WITH MICROSCOPIC
Bacteria, UA: NONE SEEN
Bilirubin Urine: NEGATIVE
Glucose, UA: NEGATIVE mg/dL
Hgb urine dipstick: NEGATIVE
KETONES UR: NEGATIVE mg/dL
Leukocytes, UA: NEGATIVE
Nitrite: NEGATIVE
PH: 6 (ref 5.0–8.0)
Protein, ur: NEGATIVE mg/dL
SPECIFIC GRAVITY, URINE: 1.015 (ref 1.005–1.030)
Squamous Epithelial / LPF: NONE SEEN (ref 0–5)

## 2018-03-20 LAB — CBC WITH DIFFERENTIAL/PLATELET
Basophils Absolute: 0 10*3/uL (ref 0–0.1)
Basophils Relative: 1 %
EOS PCT: 5 %
Eosinophils Absolute: 0.4 10*3/uL (ref 0–0.7)
HCT: 46.2 % (ref 40.0–52.0)
Hemoglobin: 15.6 g/dL (ref 13.0–18.0)
LYMPHS ABS: 1.8 10*3/uL (ref 1.0–3.6)
LYMPHS PCT: 23 %
MCH: 31.3 pg (ref 26.0–34.0)
MCHC: 33.9 g/dL (ref 32.0–36.0)
MCV: 92.5 fL (ref 80.0–100.0)
MONO ABS: 0.5 10*3/uL (ref 0.2–1.0)
Monocytes Relative: 7 %
Neutro Abs: 5.2 10*3/uL (ref 1.4–6.5)
Neutrophils Relative %: 64 %
PLATELETS: 216 10*3/uL (ref 150–440)
RBC: 4.99 MIL/uL (ref 4.40–5.90)
RDW: 16.4 % — AB (ref 11.5–14.5)
WBC: 8.1 10*3/uL (ref 3.8–10.6)

## 2018-03-20 LAB — LIPASE, BLOOD: Lipase: 35 U/L (ref 11–51)

## 2018-03-20 MED ORDER — TRAMADOL HCL 50 MG PO TABS: 50.0000 mg | ORAL_TABLET | Freq: Three times a day (TID) | ORAL | 0 refills | Status: AC | PRN

## 2018-03-20 NOTE — ED Provider Notes (Signed)
[facility] Emergency Department Provider Note ____________________________________________   First MD Initiated Contact with Patient 03/20/18 [phone]     (approximate)  I have reviewed the triage vital signs and the nursing notes.   HISTORY  Chief Complaint Back Pain    HPI Ryan Cantu is a 84 y.o. male with PMH as noted below who presents with right lower back pain which he states has been chronic and intermittent for months but acutely worsened over the last 2 days, worse with certain positions and movements, and nonradiating.  Patient denies associated nausea or vomiting, dysuria, hematuria, fever, or change in his bowel movements.  He denies any abdominal pain.  He has no weakness, numbness, or tingling going down the leg.  No trauma to the area.   Past Medical History:  Diagnosis Date  . cva 2014, 10/30   Right-sided facial droop, and right extremities weakness.  . CVA (cerebrovascular accident due to intracerebral hemorrhage) (HCC) 2010  . GERD (gastroesophageal reflux disease)   . Hypertension   . Hypothyroidism   . MI (myocardial infarction) [facility])     Patient Active Problem List   Diagnosis Date Noted  . Elevated troponin   . Chest pain   . DNR (do not resuscitate)   . Goals of care, counseling/discussion   . Non-STEMI (non-ST elevated myocardial infarction) (HCC) 01/30/2017  . Palliative care by specialist   . Advance care planning   . NSTEMI (non-ST elevated myocardial infarction) (HCC) 01/14/2017  . Acute posterior epistaxis 09/08/2016  . Weakness of right lower extremity 05/19/2015  . History of CVA (cerebrovascular accident) 05/19/2015  . Dizziness 07/30/2013  . GERD (gastroesophageal reflux disease) 07/30/2013  . CVA (cerebral vascular accident) (HCC) 07/23/2013  . Hypothyroidism 07/23/2013  . Essential hypertension 07/23/2013  . 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 EYE SURGERY      Prior to Admission medications   Medication Sig Start Date End Date Taking? Authorizing Provider  aspirin EC 81 MG EC tablet Take 1 tablet (81 mg total) by mouth daily. 01/17/17   Delfino Lovett, MD  atorvastatin (LIPITOR) 10 MG tablet Take 10 mg by mouth daily.    [provider]  clopidogrel (PLAVIX) 75 MG tablet Take 1 tablet (75 mg total) by mouth daily. Patient not taking: Reported on 06/26/2017 02/03/17   Altamese Dilling, MD  gabapentin (NEURONTIN) 100 MG capsule Take 100 mg by mouth 3 (three) times daily.    [provider]  isosorbide mononitrate (IMDUR) 60 MG 24 hr tablet Take 1 tablet (60 mg total) by mouth daily. 06/27/17   Shaune Pollack, MD  levothyroxine (SYNTHROID, LEVOTHROID) 100 MCG tablet Take 100 mcg by mouth daily before breakfast.    [provider]  lisinopril (PRINIVIL,ZESTRIL) 2.5 MG tablet Take 1 tablet (2.5 mg total) by mouth daily. 02/03/17   Altamese Dilling, MD  magnesium oxide (MAG-OX) 400 MG tablet Take 400 mg by mouth daily.    [provider]  metoprolol tartrate (LOPRESSOR) 25 MG tablet Take 1 tablet (25 mg total) by mouth daily. 06/27/17   Shaune Pollack, MD  Multiple Vitamins-Minerals (PRESERVISION AREDS PO) Take 1 tablet by mouth daily.    [provider]  omeprazole (PRILOSEC) 20 MG capsule Take 20 mg by mouth daily.     [provider]  traMADol (ULTRAM) 50 MG tablet Take 1 tablet (50 mg total) by mouth every 8 (eight) hours  as needed for up to 5 days for severe pain. 03/20/18 03/25/18  Dionne Bucy, MD    Allergies Sulfa antibiotics  Family History  Problem Relation Age of Onset  . Cancer Mother   . Cancer - Other Father   . Melanoma Brother   . Colon cancer Brother     Social History Social History   Tobacco Use  . Smoking status: Never Smoker  . Smokeless tobacco: Never Used  Substance Use Topics  . Alcohol use: No  . Drug use: No    Review of  Systems  Constitutional: No fever. Eyes: No redness. ENT: No neck pain. Cardiovascular: Denies chest pain. Respiratory: Denies shortness of breath. Gastrointestinal: No vomiting or diarrhea.  Genitourinary: Negative for dysuria.  Musculoskeletal: Positive for back pain. Skin: Negative for rash. Neurological: Negative for focal weakness or numbness.   ____________________________________________   PHYSICAL EXAM:  VITAL SIGNS: ED Triage Vitals [03/20/18 0816]  Enc Vitals Group     BP 130/86     Pulse Rate (!) 56     Resp 15     Temp 97.7 F (36.5 C)     Temp Source Oral     SpO2 95 %     Weight 180 lb (81.6 kg)     Height 5\' 9"  (1.753 m)     Head Circumference      Peak Flow      Pain Score 3     Pain Loc      Pain Edu?      Excl. in GC?     Constitutional: Alert and oriented. Well appearing for age and in no acute distress. Eyes: Conjunctivae are normal.  Head: Atraumatic. Nose: No congestion/rhinnorhea. Mouth/Throat: Mucous membranes are moist.   Neck: Normal range of motion.  Cardiovascular: Good peripheral circulation. Respiratory: Normal respiratory effort.  Gastrointestinal: Soft and nontender. No distention.  Genitourinary: No CVA tenderness.  Mild right flank tenderness. Musculoskeletal: No midline spinal tenderness.  Negative right straight leg raise.  Extremities warm and well perfused.  Neurologic:  Normal speech and language. No gross focal neurologic deficits are appreciated.  5/5 motor strength and intact sensation to bilateral lower extremities. Skin:  Skin is warm and dry. No rash noted. Psychiatric: Mood and affect are normal. Speech and behavior are normal.  ____________________________________________   LABS (all labs ordered are listed, but only abnormal results are displayed)  Labs Reviewed  COMPREHENSIVE METABOLIC PANEL - Abnormal; Notable for the following components:      Result Value   Glucose, Bld 145 (*)    All other components  within normal limits  CBC WITH DIFFERENTIAL/PLATELET - Abnormal; Notable for the following components:   RDW 16.4 (*)    All other components within normal limits  URINALYSIS, COMPLETE (UACMP) WITH MICROSCOPIC - Abnormal; Notable for the following components:   Color, Urine YELLOW (*)    APPearance CLEAR (*)    All other components within normal limits  LIPASE, BLOOD   ____________________________________________  EKG   ____________________________________________  RADIOLOGY  CT abdomen: No ureteral stone or other acute findings ____________________________________________   PROCEDURES  Procedure(s) performed: No  Procedures  Critical Care performed: No ____________________________________________   INITIAL IMPRESSION / ASSESSMENT AND PLAN / ED COURSE  Pertinent labs & imaging results that were available during my care of the patient were reviewed by me and considered in my medical decision making (see chart for details).  84 year old male with PMH as noted below presents with right lower back  pain over the last several days which is atraumatic and with no significant associated symptoms.  He reports some similar pain over at least the last month but it has been more severe in the last few days.  On exam, the patient is quite well-appearing for age, the vital signs are normal, and the remainder of the exam is as described above.  There is minimal muscle tenderness in the area, no cutaneous findings, and no bony tenderness.  During exam, the pain is exacerbated with certain positions.  Differential includes primarily muscle strain, spasm, radiculopathy, or other benign cause.  I am also considering ureteral stone, UTI/pyelonephritis, or less likely intra-abdominal cause.  Given the normal vital signs, positional nature of the pain, and benign exam, there is no evidence of vascular etiology.  Plan: UA, lab work-up, CT abdomen, and  reassess.  ----------------------------------------- 10:03 AM on 03/20/2018 -----------------------------------------  Lab work-up and UA are unremarkable.  CT shows no acute findings that would explain the patient's pain.  Therefore I still suspect most likely muscle strain/spasm versus radiculopathy as the most likely causes.    I counseled the patient and his daughter on the results of the work-up and the plan of care.  He has been taking Tylenol at home; I will prescribe tramadol for additional pain relief for more severe pain.  Patient agrees to follow-up with his primary care doctor.  Return precautions given, and the patient and his daughter expressed understanding.   ____________________________________________   FINAL CLINICAL IMPRESSION(S) / ED DIAGNOSES  Final diagnoses:  Acute right-sided low back pain without sciatica      NEW MEDICATIONS STARTED DURING THIS VISIT:  New Prescriptions   TRAMADOL (ULTRAM) 50 MG TABLET    Take 1 tablet (50 mg total) by mouth every 8 (eight) hours as needed for up to 5 days for severe pain.     Note:  This document was prepared using Dragon voice recognition software and may include unintentional dictation errors.    Dionne BucySiadecki, Aymen Widrig, MD 03/20/18 1004

## 2018-03-20 NOTE — ED Notes (Signed)

## 2018-03-20 NOTE — ED Triage Notes (Addendum)
Pt c/o lower back pain in the middle - the pain started yesterday and has increased - pt denies pain with urination but reports difficulty starting flow of urine pain is worse with movement

## 2018-03-20 NOTE — Discharge Instructions (Addendum)
Return to the ER for new, worsening, or persistent severe back pain, weakness or numbness, difficulty walking, abdominal pain, or any other new or worsening symptoms that concern you.  Follow-up with your primary care doctor.

## 2018-03-20 NOTE — ED Notes (Signed)
Pt moved to major   Report to [employer] RN

## 2018-03-20 NOTE — ED Notes (Signed)
See triage note  Presents with family  Having mid /flank pain for couple of days  Denies any hx of injury  No blood in urine but states he has been voiding less afebrile on arrival  Per family pt's girlfriend gave him a NTG PTA for pain

## 2018-04-17 DEATH — deceased

## 2018-09-03 IMAGING — CT CT RENAL STONE PROTOCOL
2 of 4 series · 15 of 46 positions shown, 17 images · non-contrast
Comparison: None.

CLINICAL DATA: [AGE] male with a history of lower back pain
and urinary symptoms

EXAM:
CT ABDOMEN AND PELVIS WITHOUT CONTRAST
TECHNIQUE: Multidetector CT imaging of the abdomen and pelvis was performed
following the standard protocol without IV contrast.

[Series 3: stone full standard · axial · 0.86mm/px · z∈[-713,-288]mm · 12 of 95 slices shown, 14 images]
[im 5/95  soft-tissue]
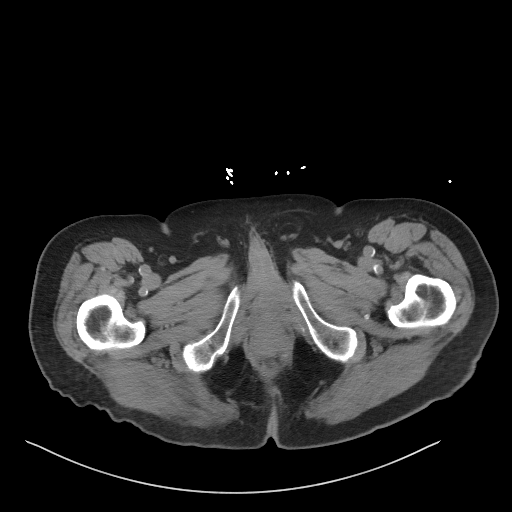
[im 5/95  bone]
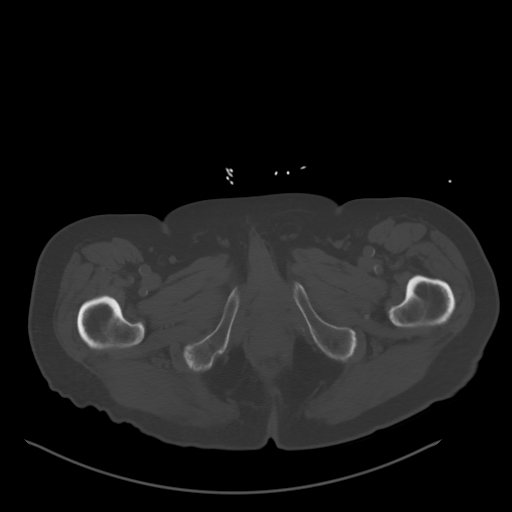
[im 13/95  soft-tissue]
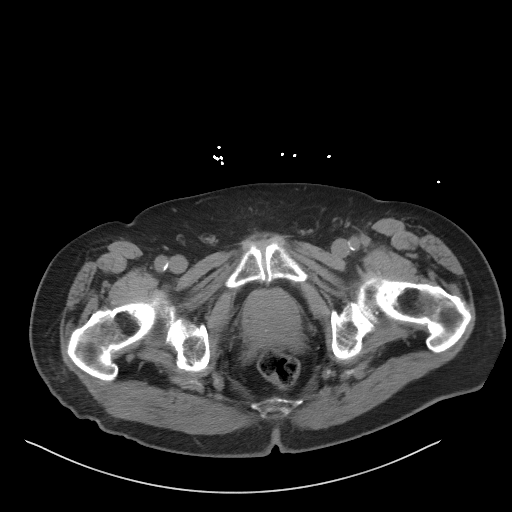
[im 21/95  soft-tissue]
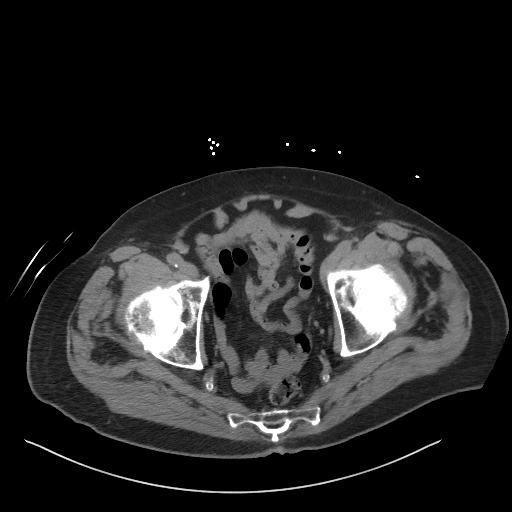
[im 29/95  soft-tissue]
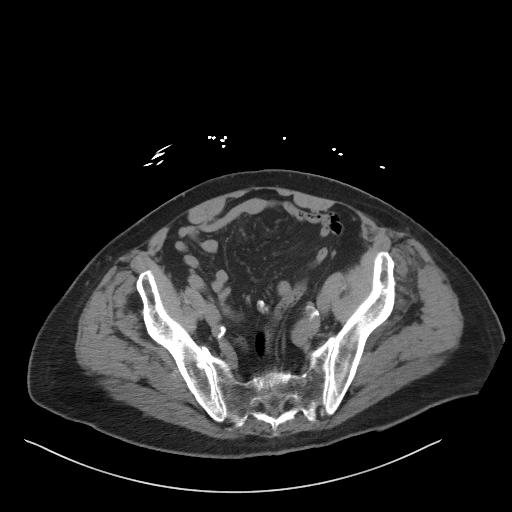
[im 37/95  soft-tissue]
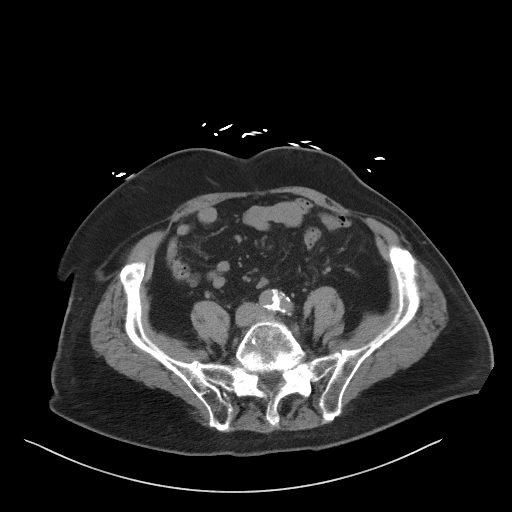
[im 45/95  soft-tissue]
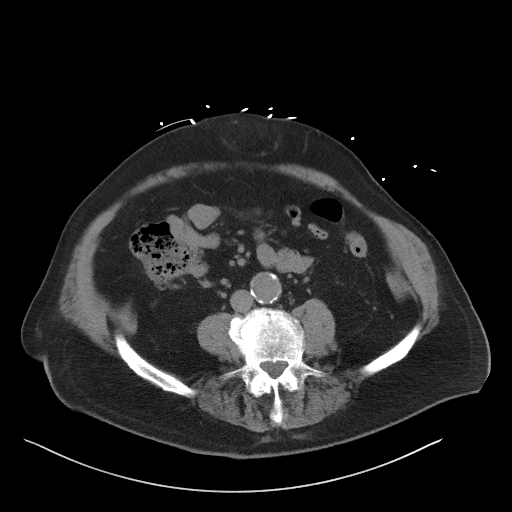
[im 50/95  soft-tissue]
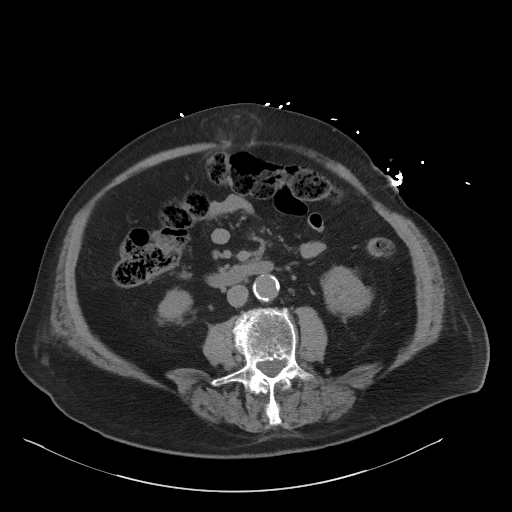
[im 58/95  soft-tissue]
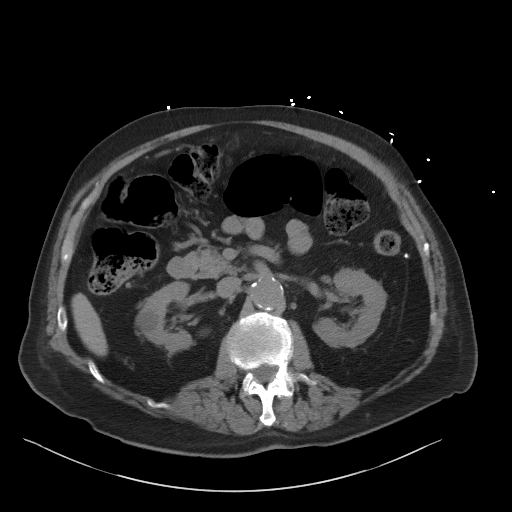
[im 66/95  soft-tissue]
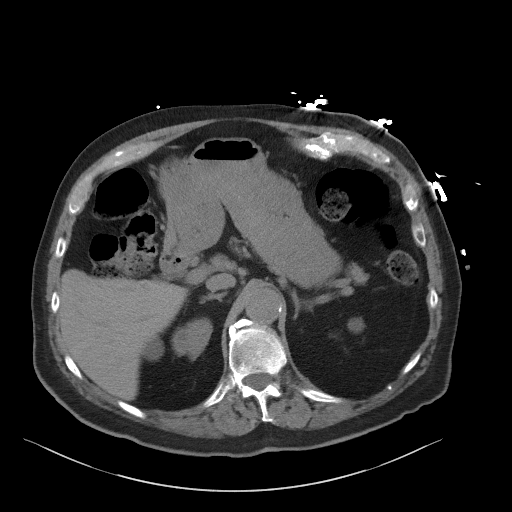
[im 66/95  bone]
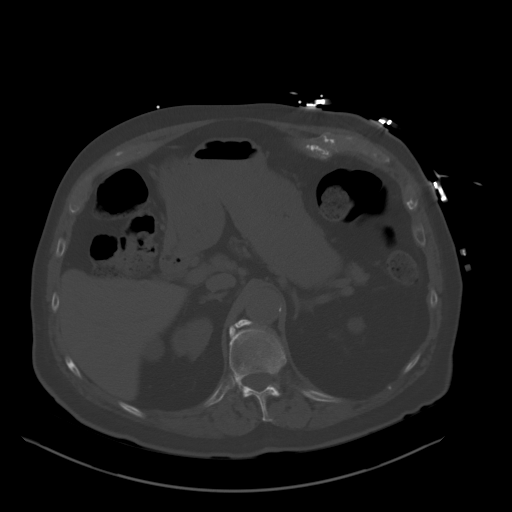
[im 74/95  soft-tissue]
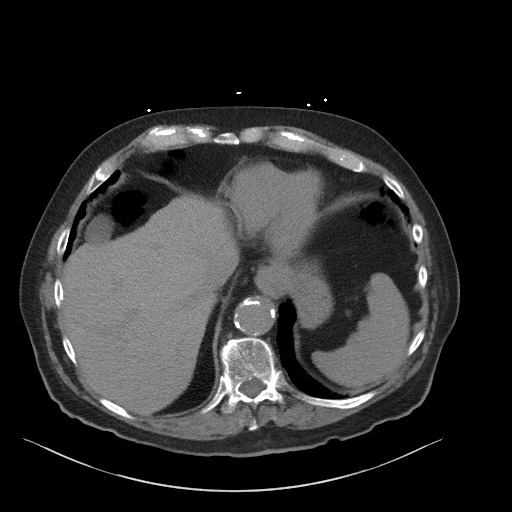
[im 82/95  soft-tissue]
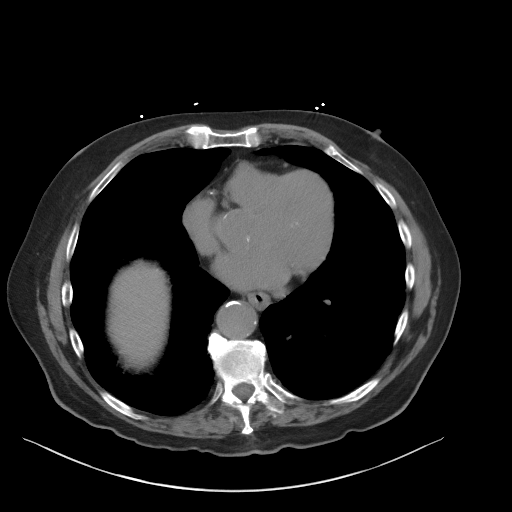
[im 90/95  soft-tissue]
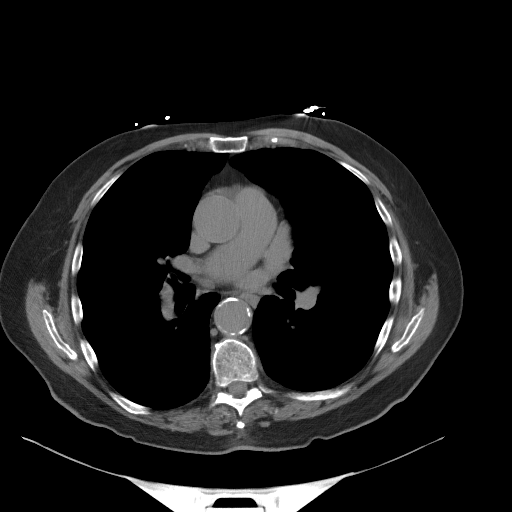

[Series 5: coronal · coronal · 0.79mm/px · 3 of 151 slices shown]
[im 51/151  soft-tissue]
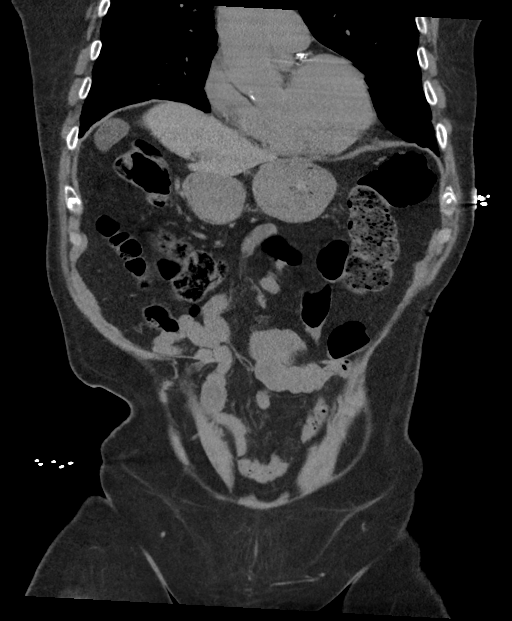
[im 67/151  soft-tissue]
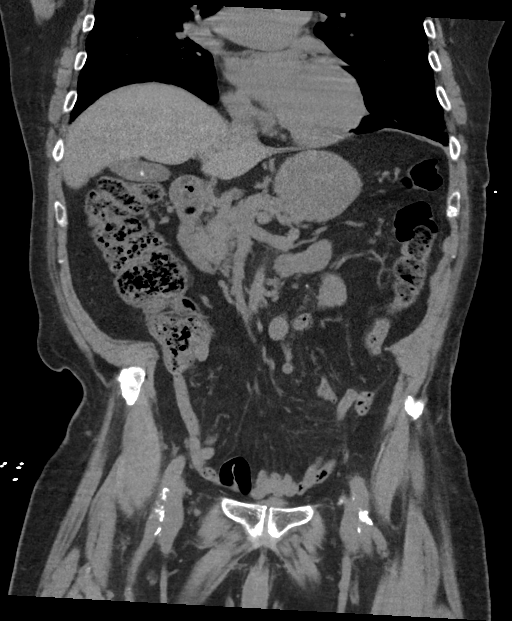
[im 84/151  soft-tissue]
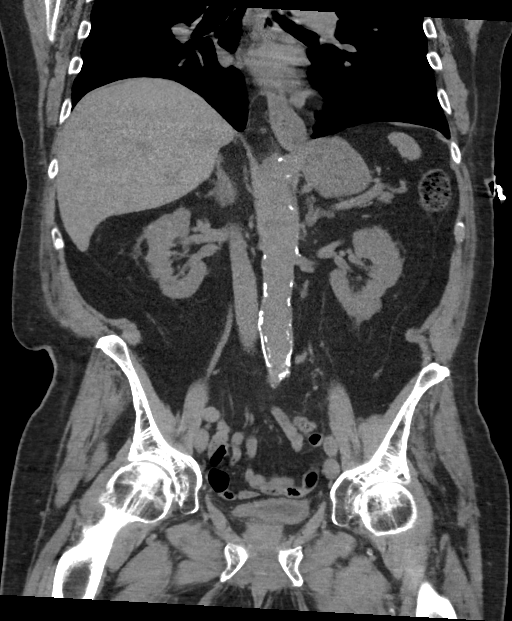

[15 of 46 positions shown; findings below may reference images not displayed]

FINDINGS: Lower chest: No acute finding of the lower chest.
Atelectasis/scarring at the posterior lung bases. Native coronary
calcifications.

Hepatobiliary: Unremarkable liver parenchyma. Calcified
cholelithiasis without associated inflammatory changes.

Pancreas: Unremarkable pancreas

Spleen: Unremarkable spleen

Adrenals/Urinary Tract: Unremarkable adrenal glands.

Right kidney without hydronephrosis. No nephrolithiasis.
Unremarkable course of the right ureter.

There are low-density cystic appearing lesions within the right
kidney measuring 2.9 cm and 2.2 cm, most likely benign Bosniak 1
cysts.

Left kidney without hydronephrosis. There are small nonobstructing
stones of the left collecting system measuring 3 mm-5 mm.

Low-density cystic lesion at the superior cortex measures 1.8 cm,
most compatible with Bosniak 1 cyst.

Unremarkable course of the left ureter.

Urinary bladder demonstrates circumferential thickening, mostly
decompressed. No inflammatory changes.

Stomach/Bowel: Hiatal hernia. Unremarkable stomach. Unremarkable
small bowel. No transition point. No abnormally distended small
bowel or colon. No local inflammatory changes. Moderate stool
burden. Normal appendix. Colonic diverticula are present without
associated inflammatory changes.

Vascular/Lymphatic: Calcifications of the abdominal aorta. No
periaortic fluid. Calcifications extend to the bilateral iliac
arteries.

No lymphadenopathy.

Reproductive: Transverse diameter of the prostate measures 5.5 cm.
Internal calcifications.

Other: Fat containing supraumbilical ventral wall hernia with
mesenteric fat. No herniation of colon or small bowel. No
significant inflammatory changes. Small fat containing umbilical
hernia.

Musculoskeletal: No acute displaced fracture. Degenerative changes
of the thoracolumbar spine. Vacuum disc phenomenon is present
spanning L2-S1. No significant bony canal narrowing. Degenerative
changes of the bilateral hips.
IMPRESSION: No acute CT finding.

There are 3 small nonobstructing left-sided renal stones. No
evidence of right-sided nephrolithiasis.

Bilateral low-density kidney lesions which are incompletely
characterized though most likely Bosniak 1 cysts.

Circumferential bladder wall thickening may reflect chronic bladder
outlet obstruction, with a transverse diameter of the prostate
measuring 5.5 cm.

Aortic Atherosclerosis ([id number]). Native coronary artery
disease.

Cholelithiasis without evidence of acute cholecystitis.

Hiatal hernia.

Diverticular disease without evidence of acute diverticulitis.

Ventral wall hernia, supraumbilical region, containing mesenteric
fat and no significant inflammatory changes.
